# Patient Record
Sex: Female | Born: 1945 | Race: White | Hispanic: No | Marital: Married | Smoking: Never smoker
Health system: Southern US, Community
[De-identification: ages and names within clinical notes are randomized; demographics above are authoritative.]

## PROBLEM LIST (undated history)

## (undated) ENCOUNTER — Ambulatory Visit

## (undated) DIAGNOSIS — I10 Essential (primary) hypertension: Secondary | ICD-10-CM

## (undated) DIAGNOSIS — K5730 Diverticulosis of large intestine without perforation or abscess without bleeding: Secondary | ICD-10-CM

## (undated) DIAGNOSIS — Z87442 Personal history of urinary calculi: Secondary | ICD-10-CM

## (undated) DIAGNOSIS — G4730 Sleep apnea, unspecified: Secondary | ICD-10-CM

## (undated) DIAGNOSIS — Z8669 Personal history of other diseases of the nervous system and sense organs: Secondary | ICD-10-CM

## (undated) DIAGNOSIS — K5792 Diverticulitis of intestine, part unspecified, without perforation or abscess without bleeding: Secondary | ICD-10-CM

## (undated) DIAGNOSIS — M1990 Unspecified osteoarthritis, unspecified site: Secondary | ICD-10-CM

## (undated) HISTORY — PX: CATARACT EXTRACTION W/ INTRAOCULAR LENS  IMPLANT, BILATERAL: SHX1307

## (undated) HISTORY — PX: OTHER SURGICAL HISTORY: SHX169

## (undated) HISTORY — PX: BREAST CYST ASPIRATION: SHX578

## (undated) HISTORY — PX: REFRACTIVE SURGERY: SHX103

---

## 1997-08-21 ENCOUNTER — Ambulatory Visit (HOSPITAL_COMMUNITY): Admission: RE | Admit: 1997-08-21 | Discharge: 1997-08-21 | Payer: Self-pay | Admitting: Obstetrics & Gynecology

## 1998-07-25 ENCOUNTER — Ambulatory Visit: Admission: RE | Admit: 1998-07-25 | Discharge: 1998-07-25 | Payer: Self-pay | Admitting: *Deleted

## 1998-09-02 ENCOUNTER — Other Ambulatory Visit: Admission: RE | Admit: 1998-09-02 | Discharge: 1998-09-02 | Payer: Self-pay | Admitting: Obstetrics & Gynecology

## 2001-12-23 ENCOUNTER — Other Ambulatory Visit: Admission: RE | Admit: 2001-12-23 | Discharge: 2001-12-23 | Payer: Self-pay | Admitting: Obstetrics & Gynecology

## 2002-12-27 ENCOUNTER — Other Ambulatory Visit: Admission: RE | Admit: 2002-12-27 | Discharge: 2002-12-27 | Payer: Self-pay | Admitting: Obstetrics & Gynecology

## 2004-03-13 ENCOUNTER — Other Ambulatory Visit: Admission: RE | Admit: 2004-03-13 | Discharge: 2004-03-13 | Payer: Self-pay | Admitting: Obstetrics & Gynecology

## 2004-05-20 ENCOUNTER — Ambulatory Visit (HOSPITAL_COMMUNITY): Admission: RE | Admit: 2004-05-20 | Discharge: 2004-05-20 | Payer: Self-pay | Admitting: Gastroenterology

## 2004-08-05 ENCOUNTER — Ambulatory Visit (HOSPITAL_COMMUNITY): Admission: RE | Admit: 2004-08-05 | Discharge: 2004-08-05 | Payer: Self-pay | Admitting: Obstetrics & Gynecology

## 2004-08-05 ENCOUNTER — Encounter (INDEPENDENT_AMBULATORY_CARE_PROVIDER_SITE_OTHER): Payer: Self-pay | Admitting: Specialist

## 2005-03-17 ENCOUNTER — Other Ambulatory Visit: Admission: RE | Admit: 2005-03-17 | Discharge: 2005-03-17 | Payer: Self-pay | Admitting: Obstetrics & Gynecology

## 2006-10-12 ENCOUNTER — Encounter: Admission: RE | Admit: 2006-10-12 | Discharge: 2006-10-12 | Payer: Self-pay | Admitting: Obstetrics and Gynecology

## 2007-03-23 ENCOUNTER — Ambulatory Visit (HOSPITAL_COMMUNITY): Admission: RE | Admit: 2007-03-23 | Discharge: 2007-03-23 | Payer: Self-pay | Admitting: Obstetrics and Gynecology

## 2007-03-23 DIAGNOSIS — K5730 Diverticulosis of large intestine without perforation or abscess without bleeding: Secondary | ICD-10-CM

## 2007-03-23 DIAGNOSIS — K5792 Diverticulitis of intestine, part unspecified, without perforation or abscess without bleeding: Secondary | ICD-10-CM

## 2007-03-23 HISTORY — DX: Diverticulosis of large intestine without perforation or abscess without bleeding: K57.30

## 2007-03-23 HISTORY — DX: Diverticulitis of intestine, part unspecified, without perforation or abscess without bleeding: K57.92

## 2010-06-06 NOTE — Op Note (Signed)
Shelby Day, Shelby Day               ACCOUNT NO.:  [id number]   MEDICAL RECORD NO.:  [id number]          PATIENT TYPE:  AMB   LOCATION:  SDC                           FACILITY:  [facility]   PHYSICIAN:  Freddy Finner, M.D.   DATE OF BIRTH:  1945-08-27   DATE OF PROCEDURE:  08/05/2004  DATE OF DISCHARGE:                                 OPERATIVE REPORT   PREOPERATIVE DIAGNOSES:  1.  Postmenopausal bleeding.  2.  Intramural leiomyomata.  3.  Possible submucous myoma.   POSTOPERATIVE DIAGNOSES:  1.  Postmenopausal bleeding.  2.  Intramural leiomyomata.  3.  Possible submucous myoma.   OPERATION/PROCEDURE:  1.  Hysteroscopy.  2.  Dilatation and curettage.   ANESTHESIA:  Intravenous sedation.   ESTIMATED BLOOD LOSS:  Less than 10 mL.   SORBITOL DEFICIT:  100 mL.   INTRAOPERATIVE COMPLICATIONS:  None.   INDICATIONS:  The patient is a 65 year old with postmenopausal bleeding who  had a sonohystogram in the office showing intramural myomas and a suggestion  of a submucous myoma.  She is admitted now for hysteroscopy and dilatation  and curettage.   DESCRIPTION OF PROCEDURE:  She was admitted on the morning of the surgery.  She was brought to the operating room and placed on IV sedation, placed in  the dorsal lithotomy position using the Sussex stirrup system.  Betadine prep  was carried out in the usual fashion.  Bivalve speculum was placed in the  vagina.  The uterus had very low descensus and the procedure was compromised  by body habitus, specifically the patient's obesity.  An os finder was used  after grasping anterior cervical lip with a single-tooth tenaculum.  The  cervix and uterus later sounded to 10 cm.  The cervix was progressively  dilated to 25 with [facility] dilators.  Initial attempts to apply the [facility]  hysteroscope again was compromised by the angle of the cervix and the  obesity but by transferring the single-tooth tenaculum to the posterior  cervical lip, the procedure  was then essentially accomplished.  The  hysteroscope was introduced.  Inspection of the endometrial cavity revealed  what appeared to be thickening of the posterior myometrium above the level  of the internal cervical os posteriorly.  This was not felt to be  technically surgically addressed with the instrumentation at hand.  Gentle  thorough curettage was then carried out and exploration with Randall stone  forceps.  The tissue was submitted for histologic examination.  Repeat  inspection of the cavity was carried out and adequate sampling was felt to  be achieved using the curettage.  Photographs were made before and after  curettage and are retained in the office records.  The patient was then  taken to recovery room in good condition.   She will discharged in the immediate postoperative period for followup in  the office in approximately seven to 10 days.  She was given routine  outpatient surgical instructions.  She is to take her regular diet.       WRN/MEDQ  D:  08/05/2004  T:  08/06/2004  Job:  [id number]

## 2010-06-06 NOTE — Op Note (Signed)
Shelby Day, Shelby Day               ACCOUNT NO.:  [id number]   MEDICAL RECORD NO.:  [id number]          PATIENT TYPE:  AMB   LOCATION:  ENDO                         FACILITY:  [facility]   PHYSICIAN:  John C. Madilyn Fireman, M.D.    DATE OF BIRTH:  [birth year]   DATE OF PROCEDURE:  05/20/2004  DATE OF DISCHARGE:                                 OPERATIVE REPORT   PROCEDURE:  Colonoscopy.   ENDOSCOPIST:  Everardo All. Madilyn Fireman, M.D.   INDICATIONS FOR PROCEDURE:  Average risk colon cancer screening.   PROCEDURE:  The patient was placed in the left lateral decubitus position  and placed on the pulse monitor with continuous low-flow oxygen delivered by  nasal cannula. She was sedated with 100 mcg of intravenous fentanyl and 10  mg of intravenous Versed. The Olympus video colonoscope was inserted into  the rectum and advanced to the cecum, confirmed by transillumination of  McBurney's point and visualization of the ileocecal valve and appendiceal  orifice. Prep was excellent. The cecum, ascending, transverse, descending,  sigmoid, and rectum all appeared normal with no masses, polyps, diverticula,  or other mucosal abnormalities. The scope was then withdrawn and the patient  returned to the recovery room in stable condition. She tolerated the  procedure well and there were no immediate complications.   IMPRESSION:  Normal colonoscopy.   PLAN:  Next colonoscopy within 10 years and consider flexible sigmoidoscopy  in 5 years.      JCH/MEDQ  D:  05/20/2004  T:  05/20/2004  Job:  [id number]   cc:   Freddy Finner, M.D.  Fax: [phone]

## 2010-10-13 LAB — CREATININE, SERUM
Creatinine, Ser: 0.66
GFR calc non Af Amer: >60

## 2012-06-07 ENCOUNTER — Other Ambulatory Visit: Payer: Self-pay | Admitting: Obstetrics and Gynecology

## 2012-06-07 DIAGNOSIS — R928 Other abnormal and inconclusive findings on diagnostic imaging of breast: Secondary | ICD-10-CM

## 2012-06-17 ENCOUNTER — Ambulatory Visit
Admission: RE | Admit: 2012-06-17 | Discharge: 2012-06-17 | Disposition: A | Discharge status: Home / Self Care | Payer: Medicare Other | Source: Ambulatory Visit | Attending: Obstetrics and Gynecology | Admitting: Obstetrics and Gynecology

## 2012-06-17 DIAGNOSIS — R928 Other abnormal and inconclusive findings on diagnostic imaging of breast: Secondary | ICD-10-CM

## 2012-11-16 ENCOUNTER — Other Ambulatory Visit: Payer: Self-pay | Admitting: Obstetrics and Gynecology

## 2012-11-16 DIAGNOSIS — N6489 Other specified disorders of breast: Secondary | ICD-10-CM

## 2012-12-20 ENCOUNTER — Ambulatory Visit
Admission: RE | Admit: 2012-12-20 | Discharge: 2012-12-20 | Disposition: A | Discharge status: Home / Self Care | Payer: Medicare Other | Source: Ambulatory Visit | Attending: Obstetrics and Gynecology | Admitting: Obstetrics and Gynecology

## 2012-12-20 DIAGNOSIS — N6489 Other specified disorders of breast: Secondary | ICD-10-CM

## 2014-09-10 ENCOUNTER — Other Ambulatory Visit: Payer: Self-pay | Admitting: Obstetrics and Gynecology

## 2014-09-11 LAB — CYTOLOGY - PAP

## 2016-03-17 ENCOUNTER — Other Ambulatory Visit: Payer: Self-pay | Admitting: Obstetrics and Gynecology

## 2016-03-17 DIAGNOSIS — Z1231 Encounter for screening mammogram for malignant neoplasm of breast: Secondary | ICD-10-CM

## 2016-03-17 DIAGNOSIS — Z803 Family history of malignant neoplasm of breast: Secondary | ICD-10-CM

## 2016-04-02 ENCOUNTER — Encounter: Payer: Self-pay | Admitting: Radiology

## 2016-04-02 ENCOUNTER — Ambulatory Visit
Admission: RE | Admit: 2016-04-02 | Discharge: 2016-04-02 | Disposition: A | Discharge status: Home / Self Care | Payer: Medicare Other | Source: Ambulatory Visit | Attending: Obstetrics and Gynecology | Admitting: Obstetrics and Gynecology

## 2016-04-02 DIAGNOSIS — Z803 Family history of malignant neoplasm of breast: Secondary | ICD-10-CM

## 2016-04-02 DIAGNOSIS — Z1231 Encounter for screening mammogram for malignant neoplasm of breast: Secondary | ICD-10-CM

## 2016-05-18 ENCOUNTER — Ambulatory Visit (INDEPENDENT_AMBULATORY_CARE_PROVIDER_SITE_OTHER): Payer: Medicare Other | Admitting: Orthopaedic Surgery

## 2016-05-18 ENCOUNTER — Ambulatory Visit (INDEPENDENT_AMBULATORY_CARE_PROVIDER_SITE_OTHER): Payer: Medicare Other

## 2016-05-18 ENCOUNTER — Encounter (INDEPENDENT_AMBULATORY_CARE_PROVIDER_SITE_OTHER): Payer: Self-pay

## 2016-05-18 DIAGNOSIS — M1612 Unilateral primary osteoarthritis, left hip: Secondary | ICD-10-CM | POA: Insufficient documentation

## 2016-05-18 DIAGNOSIS — M25552 Pain in left hip: Secondary | ICD-10-CM | POA: Diagnosis not present

## 2016-05-18 NOTE — Progress Notes (Signed)
Office Visit Note   Patient: Shelby Day           Date of Birth: 1945-01-25           MRN: [id number] Visit Date: 05/18/2016              Requested by: Melrose Nakayama, MD [place], [place] [address] PCP: Gerrit Heck, MD   Assessment & Plan: Visit Diagnoses:  1. Pain in left hip   2. Unilateral primary osteoarthritis, left hip     Plan: We had quite a long and thorough discussion in the time spent in clinic was almost 50 minutes. She has severe arthritis of her left hip and is detrimentally affected her activities daily living, her quality of life, and her mobility. I am recommending hip replacement surgery. I do feel that this could improve her quality of life as well as improve her mobility and decrease her pain. We had a discussion of what the surgery involves. We will over x-rays as well as the risk and benefits of surgery and had a thorough discussion about her intraoperative and postoperative course. All questions were encouraged and answered. She would like to have this surgery done sometime over the next month or 2. I gave her our surgery scheduler's card and she will look at potential dates and talked her further about this. I'm happy for her to give [place] a call at anytime to discuss further as well.  Follow-Up Instructions: Return for 2 weeks post-op.   Orders:  Orders Placed This Encounter  Procedures  . XR HIP UNILAT W OR W/O PELVIS 2-3 VIEWS LEFT   No orders of the defined types were placed in this encounter.     Procedures: No procedures performed   Clinical Data: No additional findings.   Subjective: No chief complaint on file. The patient comes in with chief complaint of left hip pain is been worsening over a year now. There is been no known injury. She says her sleep is decreased now secondary to pain. She's been on naproxen twice a day and that does help at times. He is working on activity modification as well as weight loss. She's  had a steroid injection her hip back in October by another physician in town. She said that helped a little bit but it didn't last long. She has pain with prolonged sitting when she gets up walking. She has difficulty getting out of a car as well as out of bed. She's work on activity modification and weight loss. She is gotten to where her pain is 10 out of 10. It's detrimentally affected her activity is daily living, her quality of life, and her mobility. She did see another orthopedic surgeon in town who recommended seeing me for direct anterior hip surgery as well as seeing her did him my experience with performing surgery morbidly obese population. Her husband is with her today. She is very pleasant individual to talk with an discuss issues with.  HPI  Review of Systems She denies any headache, chest pain, short of breath, fever, chills, nausea, vomiting.  Objective: Vital Signs: There were no vitals taken for this visit.  Physical Exam She is alert and oriented 3 and in no acute distress. She does walk with a slight limp. Ortho Exam Examination of her right nonpainful hip shows a normal hip exam. Examination of her left hip shows severe pain with internal/external rotation and a slight deficit and internal or external rotation. She has  slight knee pain on the left side as well but her radiates down from the hip. She has no pain of the trochanteric area and no back pain. Her leg lengths are near equal. She is neurovascularly intact. Specialty Comments:  No specialty comments available.  Imaging: Xr Hip Unilat W Or W/o Pelvis 2-3 Views Left  Result Date: 05/18/2016 An AP pelvis and a lateral of her left hip show severe arthritic changes of left hip comparing left and right hips. There is significant loss of the hip joint space. There sclerotic changes and cystic changes. 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    PMFS History: Patient Active Problem List    Diagnosis Date Noted  . Unilateral primary osteoarthritis, left hip 05/18/2016   No past medical history on file.  Family History  Problem Relation Age of Onset  . Breast cancer Sister     No past surgical history on file. Social History   Occupational History  . Not on file.   Social History Main Topics  . Smoking status: Not on file  . Smokeless tobacco: Not on file  . Alcohol use Not on file  . Drug use: Unknown  . Sexual activity: Not on file

## 2017-02-26 ENCOUNTER — Other Ambulatory Visit: Payer: Self-pay | Admitting: Obstetrics and Gynecology

## 2017-02-26 ENCOUNTER — Other Ambulatory Visit: Payer: Self-pay | Admitting: Family Medicine

## 2017-02-26 DIAGNOSIS — Z1231 Encounter for screening mammogram for malignant neoplasm of breast: Secondary | ICD-10-CM

## 2017-04-05 ENCOUNTER — Ambulatory Visit
Admission: RE | Admit: 2017-04-05 | Discharge: 2017-04-05 | Disposition: A | Discharge status: Home / Self Care | Payer: Medicare Other | Source: Ambulatory Visit | Attending: Family Medicine | Admitting: Family Medicine

## 2017-04-05 DIAGNOSIS — Z1231 Encounter for screening mammogram for malignant neoplasm of breast: Secondary | ICD-10-CM

## 2017-04-28 ENCOUNTER — Ambulatory Visit (INDEPENDENT_AMBULATORY_CARE_PROVIDER_SITE_OTHER): Payer: Medicare Other | Admitting: Orthopaedic Surgery

## 2017-04-28 ENCOUNTER — Ambulatory Visit (INDEPENDENT_AMBULATORY_CARE_PROVIDER_SITE_OTHER): Payer: Medicare Other

## 2017-04-28 ENCOUNTER — Encounter (INDEPENDENT_AMBULATORY_CARE_PROVIDER_SITE_OTHER): Payer: Self-pay | Admitting: Orthopaedic Surgery

## 2017-04-28 DIAGNOSIS — M1612 Unilateral primary osteoarthritis, left hip: Secondary | ICD-10-CM | POA: Diagnosis not present

## 2017-04-28 DIAGNOSIS — M25552 Pain in left hip: Secondary | ICD-10-CM | POA: Diagnosis not present

## 2017-04-28 NOTE — Progress Notes (Signed)
The patient is well-known to [place].  She has severe debilitating arthritis of her left hip is well documented.  We saw her for this last year and had a long thorough discussion about hip replacement surgery.  She is 72 years old and weighs about 270 pounds.  She says at this point her pain is daily.  She is walking with a significant limp and guarding that left hip.  She says her body is telling her that it is time.  Her pain can be 10 out of 10.  It is detrimentally affected her activities of daily living, her mobility, and her quality of life.  At this point she does wish to proceed with a total hip arthroplasty and that is actually scheduled already for June of this year.  She is not a smoker and not a diabetic.  She has no other significant active medical problems.  He does take a baby aspirin daily.  He takes naproxen for inflammation and pain.  On examination she has significant limitations of range of motion of her left hip with pain on range of motion as well.  She is having some right hip pain and right knee pain and some of this is the story.  I can move her right hip much easier.  X-rays of her left hip confirm severe end-stage arthritis of left hip with loss of joint space and sclerotic changes as well as para-articular osteophytes.  This point we gave her another handout on hip replacement surgery.  I talked in detail about what her intraoperative and postoperative course were involved.  I counseled about weight loss.  I do feel comfortable proceeding with the surgery based on the soft tissue plane and laying in a spine position.  We talked about all the risk and benefits involved with surgery.  All questions concerns were answered and addressed.  Her husband was with her as well and we went over everything we could.  We would then see her back in 2 weeks postoperative after surgery.

## 2017-06-25 ENCOUNTER — Telehealth (INDEPENDENT_AMBULATORY_CARE_PROVIDER_SITE_OTHER): Payer: Self-pay | Admitting: Orthopaedic Surgery

## 2017-06-25 NOTE — Telephone Encounter (Signed)
Patient called wanting to know what will she need to do prior to surgery. Patient want to know will she be called for a  pre-op  appointment and had questions about any payments due prior to surgery. Patient said she just don't know how to prepare for surgery. The number to contact patient is [phone]

## 2017-06-29 ENCOUNTER — Other Ambulatory Visit (INDEPENDENT_AMBULATORY_CARE_PROVIDER_SITE_OTHER): Payer: Self-pay | Admitting: Physician Assistant

## 2017-06-30 NOTE — Telephone Encounter (Signed)
I called patient and discussed.

## 2017-07-01 ENCOUNTER — Other Ambulatory Visit (INDEPENDENT_AMBULATORY_CARE_PROVIDER_SITE_OTHER): Payer: Self-pay

## 2017-07-02 ENCOUNTER — Encounter (HOSPITAL_COMMUNITY): Payer: Self-pay

## 2017-07-02 NOTE — Patient Instructions (Addendum)
Your procedure is scheduled on: Friday, July 09, 2017   Surgery Time:  12Noon-1:30PM   Report to [facility] Main  Entrance    Report to admitting at 9:30 AM   Call this number if you have problems the morning of surgery [phone]   Do not eat food  Or drink liquids after midnight      Contacts, dentures or bridgework may not be worn into surgery.   Leave suitcase in the car. After surgery it may be brought to your room.   Special Instructions: Bring a copy of your healthcare power of attorney and living will documents         the day of surgery if you haven't scanned them in before.              Please read over the following fact sheets you were given:  [facility] - Preparing for Surgery Before surgery, you can play an important role.  Because skin is not sterile, your skin needs to be as free of germs as possible.  You can reduce the number of germs on your skin by washing with CHG (chlorahexidine gluconate) soap before surgery.  CHG is an antiseptic cleaner which kills germs and bonds with the skin to continue killing germs even after washing. Please DO NOT use if you have an allergy to CHG or antibacterial soaps.  If your skin becomes reddened/irritated stop using the CHG and inform your nurse when you arrive at Short Stay. Do not shave (including legs and underarms) for at least 48 hours prior to the first CHG shower.  You may shave your face/neck.  Please follow these instructions carefully:  1.  Shower with CHG Soap the night before surgery and the  morning of surgery.  2.  If you choose to wash your hair, wash your hair first as usual with your normal  shampoo.  3.  After you shampoo, rinse your hair and body thoroughly to remove the shampoo.                             4.  Use CHG as you would any other liquid soap.  You can apply chg directly to the skin and wash.  Gently with a scrungie or clean washcloth.  5.  Apply the CHG Soap to your body ONLY FROM  THE NECK DOWN.   Do   not use on face/ open                           Wound or open sores. Avoid contact with eyes, ears mouth and   genitals (private parts).                       Wash face,  Genitals (private parts) with your normal soap.             6.  Wash thoroughly, paying special attention to the area where your    surgery  will be performed.  7.  Thoroughly rinse your body with warm water from the neck down.  8.  DO NOT shower/wash with your normal soap after using and rinsing off the CHG Soap.                9.  Pat yourself dry with a clean towel.  10.  Wear clean pajamas.            11.  Place clean sheets on your bed the night of your first shower and do not  sleep with pets. Day of Surgery : Do not apply any lotions/deodorants the morning of surgery.  Please wear clean clothes to the hospital/surgery center.  FAILURE TO FOLLOW THESE INSTRUCTIONS MAY RESULT IN THE CANCELLATION OF YOUR SURGERY  PATIENT SIGNATURE_________________________________  NURSE SIGNATURE__________________________________  ________________________________________________________________________   Shelby Day  An incentive spirometer is a tool that can help keep your lungs clear and active. This tool measures how well you are filling your lungs with each breath. Taking long deep breaths may help reverse or decrease the chance of developing breathing (pulmonary) problems (especially infection) following:  A long period of time when you are unable to move or be active. BEFORE THE PROCEDURE   If the spirometer includes an indicator to show your best effort, your nurse or respiratory therapist will set it to a desired goal.  If possible, sit up straight or lean slightly forward. Try not to slouch.  Hold the incentive spirometer in an upright position. INSTRUCTIONS FOR USE  1. Sit on the edge of your bed if possible, or sit up as far as you can in bed or on a chair. 2. Hold the  incentive spirometer in an upright position. 3. Breathe out normally. 4. Place the mouthpiece in your mouth and seal your lips tightly around it. 5. Breathe in slowly and as deeply as possible, raising the piston or the ball toward the top of the column. 6. Hold your breath for 3-5 seconds or for as long as possible. Allow the piston or ball to fall to the bottom of the column. 7. Remove the mouthpiece from your mouth and breathe out normally. 8. Rest for a few seconds and repeat Steps 1 through 7 at least 10 times every 1-2 hours when you are awake. Take your time and take a few normal breaths between deep breaths. 9. The spirometer may include an indicator to show your best effort. Use the indicator as a goal to work toward during each repetition. 10. After each set of 10 deep breaths, practice coughing to be sure your lungs are clear. If you have an incision (the cut made at the time of surgery), support your incision when coughing by placing a pillow or rolled up towels firmly against it. Once you are able to get out of bed, walk around indoors and cough well. You may stop using the incentive spirometer when instructed by your caregiver.  RISKS AND COMPLICATIONS  Take your time so you do not get dizzy or light-headed.  If you are in pain, you may need to take or ask for pain medication before doing incentive spirometry. It is harder to take a deep breath if you are having pain. AFTER USE  Rest and breathe slowly and easily.  It can be helpful to keep track of a log of your progress. Your caregiver can provide you with a simple table to help with this. If you are using the spirometer at home, follow these instructions: [place] IF:   You are having difficultly using the spirometer.  You have trouble using the spirometer as often as instructed.  Your pain medication is not giving enough relief while using the spirometer.  You develop fever of 100.5 F (38.1 C) or  higher. SEEK IMMEDIATE MEDICAL CARE IF:   You cough up  bloody sputum that had not been present before.  You develop fever of 102 F (38.9 C) or greater.  You develop worsening pain at or near the incision site. MAKE SURE YOU:   Understand these instructions.  Will watch your condition.  Will get help right away if you are not doing well or get worse. Document Released: 05/18/2006 Document Revised: 03/30/2011 Document Reviewed: 07/19/2006 Exit[employer] Patient Information 2014 Exit[employer], [place].   ________________________________________________________________________  WHAT IS A BLOOD TRANSFUSION? Blood Transfusion Information  A transfusion is the replacement of blood or some of its parts. Blood is made up of multiple cells which provide different functions.  Red blood cells carry oxygen and are used for blood loss replacement.  White blood cells fight against infection.  Platelets control bleeding.  Plasma helps clot blood.  Other blood products are available for specialized needs, such as hemophilia or other clotting disorders. BEFORE THE TRANSFUSION  Who gives blood for transfusions?   Healthy volunteers who are fully evaluated to make sure their blood is safe. This is blood bank blood. Transfusion therapy is the safest it has ever been in the practice of medicine. Before blood is taken from a donor, a complete history is taken to make sure that person has no history of diseases nor engages in risky social behavior (examples are intravenous drug use or sexual activity with multiple partners). The donor's travel history is screened to minimize risk of transmitting infections, such as malaria. The donated blood is tested for signs of infectious diseases, such as HIV and hepatitis. The blood is then tested to be sure it is compatible with you in order to minimize the chance of a transfusion reaction. If you or a relative donates blood, this is often done in anticipation of surgery  and is not appropriate for emergency situations. It takes many days to process the donated blood. RISKS AND COMPLICATIONS Although transfusion therapy is very safe and saves many lives, the main dangers of transfusion include:   Getting an infectious disease.  Developing a transfusion reaction. This is an allergic reaction to something in the blood you were given. Every precaution is taken to prevent this. The decision to have a blood transfusion has been considered carefully by your caregiver before blood is given. Blood is not given unless the benefits outweigh the risks. AFTER THE TRANSFUSION  Right after receiving a blood transfusion, you will usually feel much better and more energetic. This is especially true if your red blood cells have gotten low (anemic). The transfusion raises the level of the red blood cells which carry oxygen, and this usually causes an energy increase.  The nurse administering the transfusion will monitor you carefully for complications. HOME CARE INSTRUCTIONS  No special instructions are needed after a transfusion. You may find your energy is better. Speak with your caregiver about any limitations on activity for underlying diseases you may have. SEEK MEDICAL CARE IF:   Your condition is not improving after your transfusion.  You develop redness or irritation at the intravenous (IV) site. SEEK IMMEDIATE MEDICAL CARE IF:  Any of the following symptoms occur over the next 12 hours:  Shaking chills.  You have a temperature by mouth above 102 F (38.9 C), not controlled by medicine.  Chest, back, or muscle pain.  People around you feel you are not acting correctly or are confused.  Shortness of breath or difficulty breathing.  Dizziness and fainting.  You get a rash or develop hives.  You have a  decrease in urine output.  Your urine turns a dark color or changes to pink, red, or brown. Any of the following symptoms occur over the next 10  days:  You have a temperature by mouth above 102 F (38.9 C), not controlled by medicine.  Shortness of breath.  Weakness after normal activity.  The white part of the eye turns yellow (jaundice).  You have a decrease in the amount of urine or are urinating less often.  Your urine turns a dark color or changes to pink, red, or brown. Document Released: 01/03/2000 Document Revised: 03/30/2011 Document Reviewed: 08/22/2007 [facility] Patient Information 2014 [place], [place].  _______________________________________________________________________

## 2017-07-05 ENCOUNTER — Telehealth (INDEPENDENT_AMBULATORY_CARE_PROVIDER_SITE_OTHER): Payer: Self-pay | Admitting: Orthopaedic Surgery

## 2017-07-05 ENCOUNTER — Encounter (HOSPITAL_COMMUNITY)
Admission: RE | Admit: 2017-07-05 | Discharge: 2017-07-05 | Disposition: A | Discharge status: Home / Self Care | Payer: Medicare Other | Source: Ambulatory Visit | Attending: Orthopaedic Surgery | Admitting: Orthopaedic Surgery

## 2017-07-05 ENCOUNTER — Other Ambulatory Visit: Payer: Self-pay

## 2017-07-05 ENCOUNTER — Encounter (HOSPITAL_COMMUNITY): Payer: Self-pay

## 2017-07-05 DIAGNOSIS — Z01810 Encounter for preprocedural cardiovascular examination: Secondary | ICD-10-CM | POA: Diagnosis present

## 2017-07-05 DIAGNOSIS — Z01812 Encounter for preprocedural laboratory examination: Secondary | ICD-10-CM | POA: Diagnosis present

## 2017-07-05 HISTORY — DX: Personal history of other diseases of the nervous system and sense organs: Z86.69

## 2017-07-05 HISTORY — DX: Diverticulitis of intestine, part unspecified, without perforation or abscess without bleeding: K57.92

## 2017-07-05 HISTORY — DX: Diverticulosis of large intestine without perforation or abscess without bleeding: K57.30

## 2017-07-05 HISTORY — DX: Personal history of urinary calculi: Z87.442

## 2017-07-05 HISTORY — DX: Essential (primary) hypertension: I10

## 2017-07-05 HISTORY — DX: Unspecified osteoarthritis, unspecified site: M19.90

## 2017-07-05 HISTORY — DX: Sleep apnea, unspecified: G47.30

## 2017-07-05 LAB — CBC
HEMATOCRIT: 44.5 % (ref 36.0–46.0)
Hemoglobin: 15.3 g/dL — ABNORMAL HIGH (ref 12.0–15.0)
MCH: 33.8 pg (ref 26.0–34.0)
MCHC: 34.4 g/dL (ref 30.0–36.0)
MCV: 98.5 fL (ref 78.0–100.0)
Platelets: 236 10*3/uL (ref 150–400)
RBC: 4.52 MIL/uL (ref 3.87–5.11)
RDW: 13.3 % (ref 11.5–15.5)
WBC: 6.4 10*3/uL (ref 4.0–10.5)

## 2017-07-05 LAB — BASIC METABOLIC PANEL
Anion gap: 7 (ref 5–15)
BUN: 15 mg/dL (ref 6–20)
CO2: 30 mmol/L (ref 22–32)
Calcium: 9.6 mg/dL (ref 8.9–10.3)
Chloride: 109 mmol/L (ref 101–111)
Creatinine, Ser: 0.82 mg/dL (ref 0.44–1.00)
GFR calc non Af Amer: >60 mL/min (ref >60)
Glucose, Bld: 103 mg/dL — ABNORMAL HIGH (ref 65–99)
POTASSIUM: 4.2 mmol/L (ref 3.5–5.1)
SODIUM: 146 mmol/L — AB (ref 135–145)

## 2017-07-05 LAB — SURGICAL PCR SCREEN
MRSA, PCR: NEGATIVE
STAPHYLOCOCCUS AUREUS: POSITIVE — AB

## 2017-07-05 LAB — ABO/RH: ABO/RH(D): B POS

## 2017-07-05 NOTE — Telephone Encounter (Signed)
Gracee with [employer] called needing a PA called into  [facility] for [place].    The phone# is [phone]

## 2017-07-07 NOTE — Telephone Encounter (Signed)
Another one, I don't know what to do?

## 2017-07-07 NOTE — Telephone Encounter (Signed)
Luther Hearing from [place] an email to check on this

## 2017-07-07 NOTE — Telephone Encounter (Signed)
I assume either the social workers at the hospital or [facility] agency does this.  Check with [place].

## 2017-07-08 MED ORDER — TRANEXAMIC ACID 1000 MG/10ML IV SOLN
1000.0000 mg | INTRAVENOUS | Status: AC
  Administered 2017-07-09: 1000 mg via INTRAVENOUS
  Filled 2017-07-08: qty 1100

## 2017-07-08 MED ORDER — DEXTROSE 5 % IV SOLN
3.0000 g | INTRAVENOUS | Status: AC
  Administered 2017-07-09: 3 g via INTRAVENOUS
  Filled 2017-07-08: qty 3

## 2017-07-09 ENCOUNTER — Inpatient Hospital Stay (HOSPITAL_COMMUNITY): Payer: Medicare Other

## 2017-07-09 ENCOUNTER — Inpatient Hospital Stay (HOSPITAL_COMMUNITY): Payer: Medicare Other | Admitting: Anesthesiology

## 2017-07-09 ENCOUNTER — Other Ambulatory Visit: Payer: Self-pay

## 2017-07-09 ENCOUNTER — Encounter (HOSPITAL_COMMUNITY): Payer: Self-pay | Admitting: *Deleted

## 2017-07-09 ENCOUNTER — Inpatient Hospital Stay (HOSPITAL_COMMUNITY)
Admission: RE | Admit: 2017-07-09 | Discharge: 2017-07-11 | DRG: 470 | Disposition: A | Discharge status: Home Health | Payer: Medicare Other | Source: Ambulatory Visit | Attending: Orthopaedic Surgery | Admitting: Orthopaedic Surgery

## 2017-07-09 ENCOUNTER — Encounter (HOSPITAL_COMMUNITY)
Admission: RE | Disposition: A | Discharge status: Home Health | Payer: Self-pay | Source: Ambulatory Visit | Attending: Orthopaedic Surgery

## 2017-07-09 DIAGNOSIS — Z9181 History of falling: Secondary | ICD-10-CM

## 2017-07-09 DIAGNOSIS — F329 Major depressive disorder, single episode, unspecified: Secondary | ICD-10-CM | POA: Diagnosis present

## 2017-07-09 DIAGNOSIS — Z79899 Other long term (current) drug therapy: Secondary | ICD-10-CM | POA: Diagnosis not present

## 2017-07-09 DIAGNOSIS — Z6841 Body Mass Index (BMI) 40.0 and over, adult: Secondary | ICD-10-CM

## 2017-07-09 DIAGNOSIS — M1612 Unilateral primary osteoarthritis, left hip: Secondary | ICD-10-CM | POA: Diagnosis present

## 2017-07-09 DIAGNOSIS — M25551 Pain in right hip: Secondary | ICD-10-CM

## 2017-07-09 DIAGNOSIS — Z885 Allergy status to narcotic agent status: Secondary | ICD-10-CM | POA: Diagnosis not present

## 2017-07-09 DIAGNOSIS — I10 Essential (primary) hypertension: Secondary | ICD-10-CM | POA: Diagnosis present

## 2017-07-09 DIAGNOSIS — G4730 Sleep apnea, unspecified: Secondary | ICD-10-CM | POA: Diagnosis present

## 2017-07-09 DIAGNOSIS — Z9989 Dependence on other enabling machines and devices: Secondary | ICD-10-CM | POA: Diagnosis not present

## 2017-07-09 DIAGNOSIS — Z96642 Presence of left artificial hip joint: Secondary | ICD-10-CM

## 2017-07-09 HISTORY — PX: TOTAL HIP ARTHROPLASTY: SHX124

## 2017-07-09 LAB — TYPE AND SCREEN
ABO/RH(D): B POS
ANTIBODY SCREEN: NEGATIVE

## 2017-07-09 SURGERY — ARTHROPLASTY, HIP, TOTAL, ANTERIOR APPROACH
Anesthesia: Spinal | Site: Hip | Laterality: Left

## 2017-07-09 MED ORDER — DEXAMETHASONE SODIUM PHOSPHATE 10 MG/ML IJ SOLN
INTRAMUSCULAR | Status: DC | PRN
  Administered 2017-07-09: 10 mg via INTRAVENOUS

## 2017-07-09 MED ORDER — CALCIUM CARBONATE 1250 (500 CA) MG PO TABS
1.0000 | ORAL_TABLET | Freq: Every day | ORAL | Status: DC
  Administered 2017-07-10 – 2017-07-11 (×2): 500 mg via ORAL
  Filled 2017-07-09 (×2): qty 1

## 2017-07-09 MED ORDER — HYDROCODONE-ACETAMINOPHEN 5-325 MG PO TABS
1.0000 | ORAL_TABLET | ORAL | Status: DC | PRN
  Administered 2017-07-09 (×2): 2 via ORAL
  Administered 2017-07-10: 1 via ORAL
  Filled 2017-07-09: qty 1
  Filled 2017-07-09 (×2): qty 2

## 2017-07-09 MED ORDER — ASPIRIN 81 MG PO CHEW
81.0000 mg | CHEWABLE_TABLET | Freq: Two times a day (BID) | ORAL | Status: DC
  Administered 2017-07-09 – 2017-07-11 (×4): 81 mg via ORAL
  Filled 2017-07-09 (×4): qty 1

## 2017-07-09 MED ORDER — MIDAZOLAM HCL 2 MG/2ML IJ SOLN
INTRAMUSCULAR | Status: DC | PRN
  Administered 2017-07-09 (×2): 1 mg via INTRAVENOUS

## 2017-07-09 MED ORDER — GABAPENTIN 100 MG PO CAPS
100.0000 mg | ORAL_CAPSULE | Freq: Three times a day (TID) | ORAL | Status: DC
  Administered 2017-07-09 – 2017-07-11 (×6): 100 mg via ORAL
  Filled 2017-07-09 (×6): qty 1

## 2017-07-09 MED ORDER — TRAMADOL HCL 50 MG PO TABS
50.0000 mg | ORAL_TABLET | Freq: Four times a day (QID) | ORAL | Status: DC | PRN
Start: 2017-07-09 — End: 2017-07-11

## 2017-07-09 MED ORDER — 0.9 % SODIUM CHLORIDE (POUR BTL) OPTIME
TOPICAL | Status: DC | PRN
  Administered 2017-07-09: 1000 mL

## 2017-07-09 MED ORDER — ONDANSETRON HCL 4 MG/2ML IJ SOLN
INTRAMUSCULAR | Status: AC
  Filled 2017-07-09: qty 2

## 2017-07-09 MED ORDER — PANTOPRAZOLE SODIUM 40 MG PO TBEC
40.0000 mg | DELAYED_RELEASE_TABLET | Freq: Every day | ORAL | Status: DC
  Administered 2017-07-09 – 2017-07-11 (×3): 40 mg via ORAL
  Filled 2017-07-09 (×3): qty 1

## 2017-07-09 MED ORDER — LACTATED RINGERS IV SOLN
INTRAVENOUS | Status: DC
  Administered 2017-07-09 (×2): via INTRAVENOUS

## 2017-07-09 MED ORDER — CHLORHEXIDINE GLUCONATE 4 % EX LIQD: 60.0000 mL | Freq: Once | CUTANEOUS | Status: DC

## 2017-07-09 MED ORDER — ACETAMINOPHEN 500 MG PO TABS
1000.0000 mg | ORAL_TABLET | Freq: Once | ORAL | Status: AC
  Administered 2017-07-09: 1000 mg via ORAL
  Filled 2017-07-09: qty 2

## 2017-07-09 MED ORDER — FENTANYL CITRATE (PF) 100 MCG/2ML IJ SOLN
INTRAMUSCULAR | Status: DC | PRN
  Administered 2017-07-09 (×2): 50 ug via INTRAVENOUS

## 2017-07-09 MED ORDER — FENTANYL CITRATE (PF) 100 MCG/2ML IJ SOLN: 25.0000 ug | INTRAMUSCULAR | Status: DC | PRN

## 2017-07-09 MED ORDER — DEXAMETHASONE SODIUM PHOSPHATE 10 MG/ML IJ SOLN
INTRAMUSCULAR | Status: AC
  Filled 2017-07-09: qty 1

## 2017-07-09 MED ORDER — ONDANSETRON HCL 4 MG PO TABS: 4.0000 mg | ORAL_TABLET | Freq: Four times a day (QID) | ORAL | Status: DC | PRN

## 2017-07-09 MED ORDER — FENTANYL CITRATE (PF) 100 MCG/2ML IJ SOLN
INTRAMUSCULAR | Status: AC
  Filled 2017-07-09: qty 2

## 2017-07-09 MED ORDER — SODIUM CHLORIDE 0.9 % IR SOLN
Status: DC | PRN
  Administered 2017-07-09: 1000 mL

## 2017-07-09 MED ORDER — PROPOFOL 10 MG/ML IV BOLUS
INTRAVENOUS | Status: AC
  Filled 2017-07-09: qty 40

## 2017-07-09 MED ORDER — ONDANSETRON HCL 4 MG/2ML IJ SOLN: 4.0000 mg | Freq: Four times a day (QID) | INTRAMUSCULAR | Status: DC | PRN

## 2017-07-09 MED ORDER — SODIUM CHLORIDE 0.9 % IV SOLN
INTRAVENOUS | Status: DC
  Administered 2017-07-09: 16:00:00 via INTRAVENOUS

## 2017-07-09 MED ORDER — DIPHENHYDRAMINE HCL 12.5 MG/5ML PO ELIX: 12.5000 mg | ORAL_SOLUTION | ORAL | Status: DC | PRN

## 2017-07-09 MED ORDER — MORPHINE SULFATE (PF) 2 MG/ML IV SOLN: 0.5000 mg | INTRAVENOUS | Status: DC | PRN

## 2017-07-09 MED ORDER — PROPOFOL 500 MG/50ML IV EMUL
INTRAVENOUS | Status: DC | PRN
  Administered 2017-07-09: 50 ug/kg/min via INTRAVENOUS

## 2017-07-09 MED ORDER — ACETAMINOPHEN 325 MG PO TABS: 325.0000 mg | ORAL_TABLET | Freq: Four times a day (QID) | ORAL | Status: DC | PRN

## 2017-07-09 MED ORDER — DOCUSATE SODIUM 100 MG PO CAPS
100.0000 mg | ORAL_CAPSULE | Freq: Two times a day (BID) | ORAL | Status: DC
  Administered 2017-07-09 – 2017-07-11 (×4): 100 mg via ORAL
  Filled 2017-07-09 (×4): qty 1

## 2017-07-09 MED ORDER — VITAMIN C 500 MG PO TABS
1000.0000 mg | ORAL_TABLET | Freq: Every morning | ORAL | Status: DC
  Administered 2017-07-10 – 2017-07-11 (×2): 1000 mg via ORAL
  Filled 2017-07-09 (×2): qty 2

## 2017-07-09 MED ORDER — POLYETHYLENE GLYCOL 3350 17 G PO PACK: 17.0000 g | PACK | Freq: Every day | ORAL | Status: DC | PRN

## 2017-07-09 MED ORDER — ALUM & MAG HYDROXIDE-SIMETH 200-200-20 MG/5ML PO SUSP: 30.0000 mL | ORAL | Status: DC | PRN

## 2017-07-09 MED ORDER — METHOCARBAMOL 500 MG PO TABS
500.0000 mg | ORAL_TABLET | Freq: Four times a day (QID) | ORAL | Status: DC | PRN
  Administered 2017-07-09 – 2017-07-11 (×5): 500 mg via ORAL
  Filled 2017-07-09 (×5): qty 1

## 2017-07-09 MED ORDER — STERILE WATER FOR IRRIGATION IR SOLN
Status: DC | PRN
  Administered 2017-07-09: 2000 mL

## 2017-07-09 MED ORDER — ONDANSETRON HCL 4 MG/2ML IJ SOLN
INTRAMUSCULAR | Status: DC | PRN
  Administered 2017-07-09: 4 mg via INTRAVENOUS

## 2017-07-09 MED ORDER — ONDANSETRON HCL 4 MG/2ML IJ SOLN: 4.0000 mg | Freq: Once | INTRAMUSCULAR | Status: DC | PRN

## 2017-07-09 MED ORDER — CITALOPRAM HYDROBROMIDE 20 MG PO TABS
10.0000 mg | ORAL_TABLET | Freq: Every morning | ORAL | Status: DC
  Administered 2017-07-10 – 2017-07-11 (×2): 10 mg via ORAL
  Filled 2017-07-09: qty 1

## 2017-07-09 MED ORDER — METOCLOPRAMIDE HCL 5 MG PO TABS: 5.0000 mg | ORAL_TABLET | Freq: Three times a day (TID) | ORAL | Status: DC | PRN

## 2017-07-09 MED ORDER — PHENOL 1.4 % MT LIQD: 1.0000 | OROMUCOSAL | Status: DC | PRN

## 2017-07-09 MED ORDER — HYDROCODONE-ACETAMINOPHEN 7.5-325 MG PO TABS
1.0000 | ORAL_TABLET | ORAL | Status: DC | PRN
  Administered 2017-07-10 – 2017-07-11 (×2): 2 via ORAL
  Filled 2017-07-09 (×2): qty 2

## 2017-07-09 MED ORDER — BUPIVACAINE IN DEXTROSE 0.75-8.25 % IT SOLN
INTRATHECAL | Status: DC | PRN
  Administered 2017-07-09: 2 mL via INTRATHECAL

## 2017-07-09 MED ORDER — MIDAZOLAM HCL 2 MG/2ML IJ SOLN
INTRAMUSCULAR | Status: AC
  Filled 2017-07-09: qty 2

## 2017-07-09 MED ORDER — MENTHOL 3 MG MT LOZG: 1.0000 | LOZENGE | OROMUCOSAL | Status: DC | PRN

## 2017-07-09 MED ORDER — VITAMIN D3 25 MCG (1000 UNIT) PO TABS
5000.0000 [IU] | ORAL_TABLET | Freq: Every morning | ORAL | Status: DC
  Administered 2017-07-10 – 2017-07-11 (×2): 5000 [IU] via ORAL
  Filled 2017-07-09 (×2): qty 5

## 2017-07-09 MED ORDER — CEFAZOLIN SODIUM-DEXTROSE 2-4 GM/100ML-% IV SOLN
2.0000 g | Freq: Four times a day (QID) | INTRAVENOUS | Status: AC
  Administered 2017-07-09 (×2): 2 g via INTRAVENOUS
  Filled 2017-07-09 (×2): qty 100

## 2017-07-09 MED ORDER — METHOCARBAMOL 1000 MG/10ML IJ SOLN
500.0000 mg | Freq: Four times a day (QID) | INTRAVENOUS | Status: DC | PRN
  Filled 2017-07-09: qty 5

## 2017-07-09 MED ORDER — HYDROCHLOROTHIAZIDE 25 MG PO TABS
12.5000 mg | ORAL_TABLET | Freq: Every morning | ORAL | Status: DC
  Administered 2017-07-10 – 2017-07-11 (×2): 12.5 mg via ORAL
  Filled 2017-07-09: qty 1

## 2017-07-09 MED ORDER — METOCLOPRAMIDE HCL 5 MG/ML IJ SOLN: 5.0000 mg | Freq: Three times a day (TID) | INTRAMUSCULAR | Status: DC | PRN

## 2017-07-09 SURGICAL SUPPLY — 36 items
APL SKNCLS STERI-STRIP NONHPOA (GAUZE/BANDAGES/DRESSINGS)
BAG SPEC THK2 15X12 ZIP CLS (MISCELLANEOUS) ×1
BAG ZIPLOCK 12X15 (MISCELLANEOUS) ×2 IMPLANT
BENZOIN TINCTURE PRP APPL 2/3 (GAUZE/BANDAGES/DRESSINGS) IMPLANT
BLADE SAW SGTL 18X1.27X75 (BLADE) ×2 IMPLANT
BLADE SAW SGTL 18X1.27X75MM (BLADE) ×1
CAPT HIP TOTAL 2 ×2 IMPLANT
CLOSURE WOUND 1/2 X4 (GAUZE/BANDAGES/DRESSINGS)
COVER PERINEAL POST (MISCELLANEOUS) ×3 IMPLANT
COVER SURGICAL LIGHT HANDLE (MISCELLANEOUS) ×3 IMPLANT
DRAPE STERI IOBAN 125X83 (DRAPES) ×3 IMPLANT
DRAPE U-SHAPE 47X51 STRL (DRAPES) ×6 IMPLANT
DRSG AQUACEL AG ADV 3.5X10 (GAUZE/BANDAGES/DRESSINGS) ×3 IMPLANT
DURAPREP 26ML APPLICATOR (WOUND CARE) ×3 IMPLANT
ELECT REM PT RETURN 15FT ADLT (MISCELLANEOUS) ×3 IMPLANT
GAUZE XEROFORM 1X8 LF (GAUZE/BANDAGES/DRESSINGS) ×2 IMPLANT
GLOVE BIO SURGEON STRL SZ7.5 (GLOVE) ×3 IMPLANT
GLOVE BIOGEL PI IND STRL 8 (GLOVE) ×2 IMPLANT
GLOVE BIOGEL PI INDICATOR 8 (GLOVE) ×4
GLOVE ECLIPSE 8.0 STRL XLNG CF (GLOVE) ×3 IMPLANT
GOWN STRL REUS W/TWL XL LVL3 (GOWN DISPOSABLE) ×6 IMPLANT
HANDPIECE INTERPULSE COAX TIP (DISPOSABLE) ×3
HOLDER FOLEY CATH W/STRAP (MISCELLANEOUS) ×3 IMPLANT
PACK ANTERIOR HIP CUSTOM (KITS) ×3 IMPLANT
SET HNDPC FAN SPRY TIP SCT (DISPOSABLE) ×1 IMPLANT
STAPLER VISISTAT 35W (STAPLE) ×2 IMPLANT
STRIP CLOSURE SKIN 1/2X4 (GAUZE/BANDAGES/DRESSINGS) IMPLANT
SUT ETHIBOND NAB CT1 #1 30IN (SUTURE) ×3 IMPLANT
SUT MNCRL AB 4-0 PS2 18 (SUTURE) IMPLANT
SUT VIC AB 0 CT1 36 (SUTURE) ×3 IMPLANT
SUT VIC AB 1 CT1 36 (SUTURE) ×3 IMPLANT
SUT VIC AB 2-0 CT1 27 (SUTURE) ×6
SUT VIC AB 2-0 CT1 TAPERPNT 27 (SUTURE) ×2 IMPLANT
TRAY FOLEY CATH 14FRSI W/METER (CATHETERS) ×2 IMPLANT
TRAY FOLEY MTR SLVR 16FR STAT (SET/KITS/TRAYS/PACK) ×1 IMPLANT
YANKAUER SUCT BULB TIP 10FT TU (MISCELLANEOUS) ×3 IMPLANT

## 2017-07-09 NOTE — Op Note (Signed)
NAME: Shelby Day, Shelby Day MEDICAL RECORD [id number] ACCOUNT [id number] DATE OF BIRTH:1945/05/01 FACILITY: [facility] LOCATION: [facility] PHYSICIAN:CHRISTOPHER Kerry Fort, MD  OPERATIVE REPORT  DATE OF PROCEDURE:  07/09/2017  PREOPERATIVE DIAGNOSIS:  Primary osteoarthritis and degenerative joint disease, left hip.  POSTOPERATIVE DIAGNOSIS:  Primary osteoarthritis and degenerative joint disease, left hip.  PROCEDURE:  Left total hip arthroplasty through direct anterior approach.  IMPLANTS:  DePuy Sector Gription acetabular component size 52, size 36+0 neutral polyethylene liner, size 10 Corail femoral component with standard offset, size 36 -2 metal head ball.  SURGEON:  Jean Rosenthal, MD  ASSISTANT:  Erskine Emery, PA-C.  ANESTHESIA:  Spinal.  ANTIBIOTICS:  3 grams IV Ancef.  ESTIMATED BLOOD LOSS:  350 mL.  COMPLICATIONS:  None.  INDICATIONS:  The patient is a very pleasant, morbidly obese 72 year old female with debilitating arthritis involving her left hip.  She has tried and failed all forms of conservative treatment.  Her x-rays show significant loss and arthritis of the left  hip.  At this point, she does wish to proceed with a total hip arthroplasty, having failed all forms of conservative treatment.  She has worked on weight loss and activity modification as well.  I do feel comfortable proceeding with the surgery today.   She understands fully the risk of acute blood loss anemia, nerve or vessel injury, fracture, infection, dislocation, implant failure and DVT.  She understands our goals are to decrease pain and improve mobility and overall improved quality of life.  DESCRIPTION OF PROCEDURE:  After informed consent was obtained, the left hip was marked.  She was brought to the operating room where spinal anesthesia was obtained while she was on her stretcher.  She then had traction boots applied to on both of her  feet.  Foley catheter was placed and she was placed supine  on the Hana fracture table.  The perineal post was placed and place and both legs in the skeletal traction device and traction applied.  The left operative hip was prepped and draped with  DuraPrep and sterile drapes.  A timeout was called.  She was identified, correct patient, correct left hip.  We then made an incision just inferior and posterior to the anterior superior iliac spine and carried this obliquely down the leg.  We dissected  down to tensor fascia lata muscle.  The tensor fascia was then divided longitudinally to proceed with direct anterior approach to the hip.  We identified and cauterized.  Circumflex vessels were then identified the hip capsule over the hip capsule with  the aforementioned finding a moderate joint effusion and significant arthritis around her left hip.  We placed curb retractors around the medial and lateral femoral neck and then made our femoral neck cut with an oscillating saw and completed this with  an osteotome.  We placed a corkscrew guide in the femoral head and removed the femoral head in its entirety and found a large area devoid of cartilage.  I then placed a bent Hohmann over the medial acetabular rim and removed remnants of the acetabular  labrum and other debris.  We then began reaming from a size 43 reamer in stabilized increments up to a size 51 with all reamers under direct visualization with the last reamer under direct fluoroscopy, so we could obtain our depth in inclination and  anteversion.  Once I was pleased with this, I placed the real DePuy Sector Gription acetabular component size 52 and a 36+0 polyethylene liner for that size acetabular component.  Attention was then turned to the femur.  With the leg externally rotated  to 120 degrees, extended and adducted, we were able to place a medial retractor medially and a Homan retractor behind the greater trochanter.  We released the lateral joint capsule.  We used a box cutting osteotome to enter femoral  canal and a rongeur to  lateralize and then began broaching from a size 8 broach using the Corail system going up to size a 10.  The 10 had a nice fit in the canal.  We trialed the standard offset femoral neck and due to her tightness I went a 36 -2 hip ball.  We reduced this  in the acetabulum and I was actually pleased with leg length, offset, range of motion and stability.  We then dislocated the hip and removed the trial components.  I placed the real Corail femoral component with standard offset size 10 and the real 36 -2  metal hip ball.  Again reducing this in the acetabulum and I was pleased with stability.  We then irrigated the soft tissue with normal saline solution using pulsatile lavage.  I was able to close the joint capsule with interrupted #1 Ethibond suture,  followed by running 0 Vicryl in tensor fascia, 0 Vicryl in the deep tissue, 2-0 Vicryl subcutaneous tissue, interrupted staples on the skin.  Xeroform and Aquacel dressing was applied.  She was then taken off the Hana table and taken to recovery room in  stable condition.  All final counts were correct.  There were no complications noted.  Of note, Benita Stabile, PA-C assisted in the entire case.  His assistance was crucial in facilitating all aspects of this case.  TN/NUANCE  D:07/09/2017 T:07/09/2017 JOB:[id number]

## 2017-07-09 NOTE — Brief Op Note (Signed)
07/09/2017  12:42 PM  PATIENT:  Shelby Day  72 y.o. female  PRE-OPERATIVE DIAGNOSIS:  osteoarthritis left hip  POST-OPERATIVE DIAGNOSIS:  osteoarthritis left hip  PROCEDURE:  Procedure(s): LEFT TOTAL HIP ARTHROPLASTY ANTERIOR APPROACH (Left)  SURGEON:  Surgeon(s) and Role:    Mcarthur Rossetti, MD - Primary  PHYSICIAN ASSISTANT: Benita Stabile, PA-C  ANESTHESIA:   spinal  EBL:  400 mL   COUNTS:  YES  DICTATION: .Other Dictation: Dictation Number [id number]  PLAN OF CARE: Admit to inpatient   PATIENT DISPOSITION:  PACU - hemodynamically stable.   Delay start of Pharmacological VTE agent (>24hrs) due to surgical blood loss or risk of bleeding: no

## 2017-07-09 NOTE — Anesthesia Postprocedure Evaluation (Signed)
Anesthesia Post Note  Patient: KAIRY FOLSOM  Procedure(s) Performed: LEFT TOTAL HIP ARTHROPLASTY ANTERIOR APPROACH (Left Hip)     Patient location during evaluation: PACU Anesthesia Type: Spinal Level of consciousness: oriented and awake and alert Pain management: pain level controlled Vital Signs Assessment: post-procedure vital signs reviewed and stable Respiratory status: spontaneous breathing, respiratory function stable and patient connected to nasal cannula oxygen Cardiovascular status: blood pressure returned to baseline and stable Postop Assessment: no headache, no backache, no apparent nausea or vomiting, spinal receding and patient able to bend at knees Anesthetic complications: no    Last Vitals:  Vitals:   07/09/17 1444 07/09/17 1502  BP: 99/79 130/85  Pulse: 60 (!) 56  Resp: 16   Temp: 37.1 C   SpO2: 96%     Last Pain:  Vitals:   07/09/17 1400  TempSrc:   PainSc: 0-No pain                 Catalina Gravel

## 2017-07-09 NOTE — Anesthesia Procedure Notes (Signed)
Procedure Name: MAC Date/Time: 07/09/2017 11:34 AM Performed by: Dione Booze, CRNA Pre-anesthesia Checklist: Patient identified, Emergency Drugs available, Suction available and Patient being monitored Patient Re-evaluated:Patient Re-evaluated prior to induction Oxygen Delivery Method: Simple face mask Placement Confirmation: positive ETCO2

## 2017-07-09 NOTE — Anesthesia Procedure Notes (Signed)
Spinal  Patient location during procedure: OR Start time: 07/09/2017 11:25 AM End time: 07/09/2017 11:30 AM Staffing Anesthesiologist: Catalina Gravel, MD Performed: anesthesiologist  Preanesthetic Checklist Completed: patient identified, surgical consent, pre-op evaluation, timeout performed, IV checked, risks and benefits discussed and monitors and equipment checked Spinal Block Patient position: sitting Prep: site prepped and draped and DuraPrep Patient monitoring: continuous pulse ox and blood pressure Approach: midline Location: L3-4 Injection technique: single-shot Needle Needle type: Pencan  Needle gauge: 24 G Assessment Sensory level: T8 Additional Notes Functioning IV was confirmed and monitors were applied. Sterile prep and drape, including hand hygiene, mask and sterile gloves were used. The patient was positioned and the spine was prepped. The skin was anesthetized with lidocaine.  Free flow of clear CSF was obtained prior to injecting local anesthetic into the CSF.  The spinal needle aspirated freely following injection.  The needle was carefully withdrawn.  The patient tolerated the procedure well. Consent was obtained prior to procedure with all questions answered and concerns addressed. Risks including but not limited to bleeding, infection, nerve damage, paralysis, failed block, inadequate analgesia, allergic reaction, high spinal, itching and headache were discussed and the patient wished to proceed.   Hoy Morn, MD

## 2017-07-09 NOTE — Anesthesia Preprocedure Evaluation (Addendum)
Anesthesia Evaluation  Patient identified by MRN, date of birth, ID band Patient awake    Reviewed: Allergy & Precautions, NPO status , Patient's Chart, lab work & pertinent test results  Airway Mallampati: III  TM Distance: >3 FB Neck ROM: Full    Dental  (+) Dental Advisory Given, Chipped,    Pulmonary sleep apnea and Continuous Positive Airway Pressure Ventilation ,    Pulmonary exam normal breath sounds clear to auscultation       Cardiovascular hypertension, Pt. on medications Normal cardiovascular exam Rhythm:Regular Rate:Normal     Neuro/Psych PSYCHIATRIC DISORDERS Depression negative neurological ROS     GI/Hepatic Neg liver ROS, Diverticulitis    Endo/Other  Morbid obesity  Renal/GU negative Renal ROS     Musculoskeletal  (+) Arthritis , Osteoarthritis,    Abdominal   Peds  Hematology negative hematology ROS (+) Plt 236k   Anesthesia Other Findings Day of surgery medications reviewed with the patient.  Reproductive/Obstetrics                            Anesthesia Physical Anesthesia Plan  ASA: III  Anesthesia Plan: Spinal   Post-op Pain Management:    Induction:   PONV Risk Score and Plan: 2 and Ondansetron, Dexamethasone and Propofol infusion  Airway Management Planned: Natural Airway and Simple Face Mask  Additional Equipment:   Intra-op Plan:   Post-operative Plan:   Informed Consent: I have reviewed the patients History and Physical, chart, labs and discussed the procedure including the risks, benefits and alternatives for the proposed anesthesia with the patient or authorized representative who has indicated his/her understanding and acceptance.   Dental advisory given  Plan Discussed with: CRNA, Anesthesiologist and Surgeon  Anesthesia Plan Comments:         Anesthesia Quick Evaluation

## 2017-07-09 NOTE — H&P (Signed)
TOTAL HIP ADMISSION H&P  Patient is admitted for left total hip arthroplasty.  Subjective:  Chief Complaint: left hip pain  HPI: Shelby Day, 72 y.o. female, has a history of pain and functional disability in the left hip(s) due to arthritis and patient has failed non-surgical conservative treatments for greater than 12 weeks to include NSAID's and/or analgesics, corticosteriod injections, flexibility and strengthening excercises, use of assistive devices, weight reduction as appropriate and activity modification.  Onset of symptoms was gradual starting 2 years ago with gradually worsening course since that time.The patient noted no past surgery on the left hip(s).  Patient currently rates pain in the left hip at 10 out of 10 with activity. Patient has night pain, worsening of pain with activity and weight bearing, trendelenberg gait, pain that interfers with activities of daily living and pain with passive range of motion. Patient has evidence of subchondral cysts, subchondral sclerosis, periarticular osteophytes and joint space narrowing by imaging studies. This condition presents safety issues increasing the risk of falls.  There is no current active infection.  Patient Active Problem List   Diagnosis Date Noted  . Unilateral primary osteoarthritis, left hip 05/18/2016   Past Medical History:  Diagnosis Date  . Arthritis    end-stage left hip  . Diverticulitis 03/23/2007   Noted on CT pelvis: Mild to moderate of prox. sigmoid colon  . Diverticulosis of sigmoid colon 03/23/2007   Noted on CT pelvis  . History of cataract    Bilateral  . History of kidney stones   . Hypertension   . Sleep apnea    USES CPAP    Past Surgical History:  Procedure Laterality Date  . BLADDER TACH    . CATARACT EXTRACTION W/ INTRAOCULAR LENS  IMPLANT, BILATERAL  01/31/2014 and 02/07/2014  . REFRACTIVE SURGERY Bilateral    20+ years ago LASIX    Current Facility-Administered Medications  Medication  Dose Route Frequency Provider Last Rate Last Dose  . ceFAZolin (ANCEF) 3 g in dextrose 5 % 50 mL IVPB  3 g Intravenous On Call to OR Mcarthur Rossetti, MD      . tranexamic acid (CYKLOKAPRON) 1,000 mg in sodium chloride 0.9 % 100 mL IVPB  1,000 mg Intravenous On Call to [place], [place], MD       Current Outpatient Medications  Medication Sig Dispense Refill Last Dose  . Ascorbic Acid (VITAMIN C) 1000 MG tablet Take 1,000 mg by mouth every morning.     . calcium carbonate (OSCAL) 1500 (600 Ca) MG TABS tablet Take 600 mg of elemental calcium by mouth daily with breakfast.     . Cholecalciferol (VITAMIN D3) 5000 units TABS Take 5,000 Units by mouth every morning.     . citalopram (CELEXA) 10 MG tablet Take 10 mg by mouth every morning.   0 Taking  . Glucosamine-MSM-Hyaluronic Acd (JOINT HEALTH PO) Take 1 tablet by mouth every morning.     . hydrochlorothiazide (HYDRODIURIL) 25 MG tablet Take 12.5 mg by mouth every morning.    Taking  . Krill Oil 500 MG CAPS Take 500 mg by mouth every morning.     Marland Kitchen MELATONIN PO Take 0.5 tablets by mouth at bedtime as needed (sleep).     . Multiple Vitamin (MULTIVITAMIN WITH MINERALS) TABS tablet Take 2 tablets by mouth every morning.     . naproxen sodium (ALEVE) 220 MG tablet Take 220 mg by mouth 2 (two) times daily as needed (hip pain.).  Allergies  Allergen Reactions  . Codeine Nausea And Vomiting    Social History   Tobacco Use  . Smoking status: Never Smoker  . Smokeless tobacco: Never Used  Substance Use Topics  . Alcohol use: Yes    Comment: RARE    Family History  Problem Relation Age of Onset  . Breast cancer Sister        diagnosed in her early 83's     Review of Systems  Musculoskeletal: Positive for joint pain.  All other systems reviewed and are negative.   Objective:  Physical Exam  Constitutional: She is oriented to person, place, and time. She appears well-developed and well-nourished.  HENT:  Head:  Normocephalic and atraumatic.  Eyes: Pupils are equal, round, and reactive to light. EOM are normal.  Neck: Normal range of motion. Neck supple.  Cardiovascular: Normal rate and regular rhythm.  Respiratory: Effort normal and breath sounds normal.  GI: Soft. Bowel sounds are normal.  Musculoskeletal:       Left hip: She exhibits decreased range of motion, decreased strength, tenderness and bony tenderness.  Neurological: She is alert and oriented to person, place, and time.  Skin: Skin is warm and dry.  Psychiatric: She has a normal mood and affect.    Vital signs in last 24 hours:    Labs:   Estimated body mass index is 48.01 kg/m as calculated from the following:   Height as of 07/05/17: 5\' 3"  (1.6 m).   Weight as of 07/05/17: 271 lb (122.9 kg).   Imaging Review Plain radiographs demonstrate severe degenerative joint disease of the left hip(s). The bone quality appears to be good for age and reported activity level.    Preoperative templating of the joint replacement has been completed, documented, and submitted to the Operating Room personnel in order to optimize intra-operative equipment management.     Assessment/Plan:  End stage arthritis, left hip(s)  The patient history, physical examination, clinical judgement of the provider and imaging studies are consistent with end stage degenerative joint disease of the left hip(s) and total hip arthroplasty is deemed medically necessary. The treatment options including medical management, injection therapy, arthroscopy and arthroplasty were discussed at length. The risks and benefits of total hip arthroplasty were presented and reviewed. The risks due to aseptic loosening, infection, stiffness, dislocation/subluxation,  thromboembolic complications and other imponderables were discussed.  The patient acknowledged the explanation, agreed to proceed with the plan and consent was signed. Patient is being admitted for inpatient  treatment for surgery, pain control, PT, OT, prophylactic antibiotics, VTE prophylaxis, progressive ambulation and ADL's and discharge planning.The patient is planning to be discharged home with home health services

## 2017-07-09 NOTE — Transfer of Care (Signed)
Immediate Anesthesia Transfer of Care Note  Patient: Shelby Day  Procedure(s) Performed: LEFT TOTAL HIP ARTHROPLASTY ANTERIOR APPROACH (Left Hip)  Patient Location: PACU  Anesthesia Type:MAC and Spinal  Level of Consciousness: awake, alert  and patient cooperative  Airway & Oxygen Therapy: Patient Spontanous Breathing and Patient connected to face mask oxygen  Post-op Assessment: Report given to RN and Post -op Vital signs reviewed and stable  Post vital signs: Reviewed and stable  Last Vitals:  Vitals Value Taken Time  BP 114/72 07/09/2017  1:01 PM  Temp    Pulse 68 07/09/2017  1:02 PM  Resp 19 07/09/2017  1:02 PM  SpO2 98 % 07/09/2017  1:02 PM  Vitals shown include unvalidated device data.  Last Pain:  Vitals:   07/09/17 0940  TempSrc:   PainSc: 0-No pain      Patients Stated Pain Goal: 3 (53/61/44 3154)  Complications: No apparent anesthesia complications

## 2017-07-10 LAB — BASIC METABOLIC PANEL
ANION GAP: 7 (ref 5–15)
BUN: 16 mg/dL (ref 6–20)
CALCIUM: 8.8 mg/dL — AB (ref 8.9–10.3)
CO2: 27 mmol/L (ref 22–32)
Chloride: 108 mmol/L (ref 101–111)
Creatinine, Ser: 0.71 mg/dL (ref 0.44–1.00)
GFR calc Af Amer: >60 mL/min (ref >60)
Glucose, Bld: 133 mg/dL — ABNORMAL HIGH (ref 65–99)
Potassium: 4 mmol/L (ref 3.5–5.1)
SODIUM: 142 mmol/L (ref 135–145)

## 2017-07-10 LAB — CBC
HCT: 37.4 % (ref 36.0–46.0)
Hemoglobin: 13.1 g/dL (ref 12.0–15.0)
MCH: 34 pg (ref 26.0–34.0)
MCHC: 35 g/dL (ref 30.0–36.0)
MCV: 97.1 fL (ref 78.0–100.0)
PLATELETS: 228 10*3/uL (ref 150–400)
RBC: 3.85 MIL/uL — AB (ref 3.87–5.11)
RDW: 13 % (ref 11.5–15.5)
WBC: 12.9 10*3/uL — AB (ref 4.0–10.5)

## 2017-07-10 NOTE — Evaluation (Signed)
Physical Therapy Evaluation Patient Details Name: Shelby Day MRN: [id number] DOB: 09-Apr-1945 Today's Date: 07/10/2017   History of Present Illness  L DATHA  Clinical Impression  The patient is progressing well. Plans Dc tomorrow. Pt admitted with above diagnosis. Pt currently with functional limitations due to the deficits listed below (see PT Problem List).  Pt will benefit from skilled PT to increase their independence and safety with mobility to allow discharge to the venue listed below.       Follow Up Recommendations Follow surgeon's recommendation for DC plan and follow-up therapies;Home health PT    Equipment Recommendations  None recommended by PT    Recommendations for Other Services       Precautions / Restrictions Precautions Precautions: Fall Restrictions Weight Bearing Restrictions: No      Mobility  Bed Mobility Overal bed mobility: Needs Assistance Bed Mobility: Supine to Sit     Supine to sit: Min assist;HOB elevated        Transfers Overall transfer level: Needs assistance Equipment used: Rolling walker (2 wheeled) Transfers: Sit to/from Stand Sit to Stand: Min assist         General transfer comment: cues for hand and  left leg placement  Ambulation/Gait Ambulation/Gait assistance: Min assist Gait Distance (Feet): 180 Feet Assistive device: Rolling walker (2 wheeled) Gait Pattern/deviations: Step-to pattern;Step-through pattern     General Gait Details: cues for sequence and position inside the [facility]            Wheelchair Mobility    Modified Rankin (Stroke Patients Only)       Balance                                             Pertinent Vitals/Pain Pain Assessment: 0-10 Pain Score: 4  Pain Location: left thigh Pain Descriptors / Indicators: Tightness;Sore Pain Intervention(s): Limited activity within patient's tolerance    Home Living Family/patient expects to be discharged to::  Private residence Living Arrangements: Spouse/significant other Available Help at Discharge: Family Type of Home: House Home Access: Level entry       Home Equipment: Hospital bed Additional Comments: trapeze    Prior Function Level of Independence: Independent               Hand Dominance        Extremity/Trunk Assessment   Upper Extremity Assessment Upper Extremity Assessment: Overall WFL for tasks assessed    Lower Extremity Assessment Lower Extremity Assessment: LLE deficits/detail LLE Deficits / Details: flexes the hip in supine, advances with ambulation       Communication   Communication: No difficulties  Cognition Arousal/Alertness: Awake/alert Behavior During Therapy: WFL for tasks assessed/performed Overall Cognitive Status: Within Functional Limits for tasks assessed                                        General Comments      Exercises Total Joint Exercises Ankle Circles/Pumps: AROM;10 reps;Both Quad Sets: 10 reps;Both Short Arc Quad: AROM;Left;10 reps Heel Slides: AAROM;Left;10 reps Hip ABduction/ADduction: AAROM;Left;10 reps   Assessment/Plan    PT Assessment Patient needs continued PT services  PT Problem List Decreased strength;Decreased range of motion;Decreased activity tolerance;Decreased mobility;Decreased knowledge of precautions;Decreased safety awareness;Pain;Decreased knowledge of use of DME  PT Treatment Interventions DME instruction;Gait training;Functional mobility training;Therapeutic activities;Therapeutic exercise;Patient/family education    PT Goals (Current goals can be found in the Care Plan section)  Acute Rehab PT Goals Patient Stated Goal: to garden and  tie my shoes PT Goal Formulation: With patient/family Time For Goal Achievement: 07/13/17 Potential to Achieve Goals: Good    Frequency 7X/week   Barriers to discharge Decreased caregiver support      Co-evaluation                AM-PAC PT "6 Clicks" Daily Activity  Outcome Measure Difficulty turning over in bed (including adjusting bedclothes, sheets and blankets)?: A Lot Difficulty moving from lying on back to sitting on the side of the bed? : A Lot Difficulty sitting down on and standing up from a chair with arms (e.g., wheelchair, bedside commode, etc,.)?: A Lot Help needed moving to and from a bed to chair (including a wheelchair)?: A Lot Help needed walking in hospital room?: A Lot Help needed climbing 3-5 steps with a railing? : Total 6 Click Score: 11    End of Session   Activity Tolerance: Patient tolerated treatment well Patient left: in chair;with call bell/phone within reach Nurse Communication: Mobility status PT Visit Diagnosis: Difficulty in walking, not elsewhere classified (R26.2);Pain Pain - Right/Left: Left Pain - part of body: Hip    Time: 0350-0938 PT Time Calculation (min) (ACUTE ONLY): 52 min   Charges:   PT Evaluation $PT Eval Low Complexity: 1 Low PT Treatments $Gait Training: 8-22 mins $Therapeutic Exercise: 8-22 mins   PT G CodesTresa Endo PT [phone] e}  Claretha Cooper 07/10/2017, 10:14 AM

## 2017-07-10 NOTE — Discharge Instructions (Signed)

## 2017-07-10 NOTE — Evaluation (Signed)
Occupational Therapy Evaluation Patient Details Name: Shelby Day MRN: [id number] DOB: 06-27-1945 Today's Date: 07/10/2017    History of Present Illness L DATHA   Clinical Impression   Pt admitted with the above diagnoses and presents with below problem list. Pt will benefit from continued acute OT to address the below listed deficits and maximize independence with basic ADLs prior to d/c home. PTA pt was independent with ADLs. Pt is currently min A with LB ADLs and functional transfers.      Follow Up Recommendations  No OT follow up;Supervision - Intermittent    Equipment Recommendations  None recommended by OT    Recommendations for Other Services       Precautions / Restrictions Precautions Precautions: Fall Restrictions Weight Bearing Restrictions: No Other Position/Activity Restrictions: WBAT      Mobility Bed Mobility Overal bed mobility: Needs Assistance Bed Mobility: Supine to Sit     Supine to sit: Min assist;HOB elevated     General bed mobility comments: received in recliner  Transfers Overall transfer level: Needs assistance Equipment used: Rolling walker (2 wheeled) Transfers: Sit to/from Stand Sit to Stand: Min guard;Min assist         General transfer comment: cues for hand and  left leg placement; better with higher seat surfaces. successful on second attempt from recliner    Balance Overall balance assessment: Needs assistance         Standing balance support: Bilateral upper extremity supported;During functional activity Standing balance-Leahy Scale: Poor Standing balance comment: external support for balance                           ADL either performed or assessed with clinical judgement   ADL Overall ADL's : Needs assistance/impaired Eating/Feeding: Set up;Sitting   Grooming: Sitting;Min guard;Set up;Standing   Upper Body Bathing: Set up;Sitting   Lower Body Bathing: Minimal assistance;Sit to/from stand    Upper Body Dressing : Set up;Sitting   Lower Body Dressing: Minimal assistance;Sit to/from stand   Toilet Transfer: Ambulation;RW;Minimal Print production planner Details (indicate cue type and reason): 3n1 over toilet Toileting- Clothing Manipulation and Hygiene: Sit to/from stand;Minimal assistance;Min guard   Tub/ Shower Transfer: Walk-in shower;Min guard;Ambulation;3 in 1;Rolling walker Tub/Shower Transfer Details (indicate cue type and reason): practiced stepping into and out of shower Functional mobility during ADLs: Min guard;Rolling walker General ADL Comments: Pt completed simulated toilet transfer and shower transfer. Educated on techniques and AE for LB ADLs.      Vision         Perception     Praxis      Pertinent Vitals/Pain Pain Assessment: Faces Pain Score: 4  Faces Pain Scale: Hurts little more Pain Location: left thigh Pain Descriptors / Indicators: Tightness;Sore Pain Intervention(s): Monitored during session;Repositioned     Hand Dominance     Extremity/Trunk Assessment Upper Extremity Assessment Upper Extremity Assessment: Overall WFL for tasks assessed   Lower Extremity Assessment Lower Extremity Assessment: Defer to PT evaluation LLE Deficits / Details: flexes the hip in supine, advances with ambulation       Communication Communication Communication: No difficulties   Cognition Arousal/Alertness: Awake/alert Behavior During Therapy: WFL for tasks assessed/performed Overall Cognitive Status: Within Functional Limits for tasks assessed                                     General Comments  Shoulder Instructions      Home Living Family/patient expects to be discharged to:: Private residence Living Arrangements: Spouse/significant other Available Help at Discharge: Family Type of Home: House Home Access: Level entry           Bathroom Shower/Tub: Walk-in Corporate treasurer Toilet: [place]: Environmental consultant - 2 wheels;Grab bars - tub/shower;Hospital bed;Shower seat;Toilet riser   Additional Comments: trapeze      Prior Functioning/Environment Level of Independence: Independent                 OT Problem List: Impaired balance (sitting and/or standing);Decreased knowledge of use of DME or AE;Decreased knowledge of precautions;Pain      OT Treatment/Interventions: Self-care/ADL training;DME and/or AE instruction;Therapeutic activities;Patient/family education;Balance training    OT Goals(Current goals can be found in the care plan section) Acute Rehab OT Goals Patient Stated Goal: to garden and  tie my shoes OT Goal Formulation: With patient Time For Goal Achievement: 07/17/17 Potential to Achieve Goals: Good ADL Goals Pt Will Perform Lower Body Bathing: with adaptive equipment;with modified independence;sit to/from stand Pt Will Perform Lower Body Dressing: with modified independence;sit to/from stand;with adaptive equipment Pt Will Transfer to Toilet: with supervision;ambulating Pt Will Perform Toileting - Clothing Manipulation and hygiene: with supervision;sit to/from stand Pt Will Perform Tub/Shower Transfer: Shower transfer;with supervision;ambulating;3 in 1;rolling walker  OT Frequency: Min 2X/week   Barriers to D/C:            Co-evaluation              AM-PAC PT "6 Clicks" Daily Activity     Outcome Measure Help from another person eating meals?: None Help from another person taking care of personal grooming?: None Help from another person toileting, which includes using toliet, bedpan, or urinal?: A Little Help from another person bathing (including washing, rinsing, drying)?: A Little Help from another person to put on and taking off regular upper body clothing?: None Help from another person to put on and taking off regular lower body clothing?: A Little 6 Click Score: 21   End of Session Equipment Utilized During Treatment: Rolling  walker  Activity Tolerance: Patient tolerated treatment well Patient left: in chair;with call bell/phone within reach  OT Visit Diagnosis: Pain;Other abnormalities of gait and mobility (R26.89) Pain - Right/Left: Left Pain - part of body: Hip                Time: 4665-9935 OT Time Calculation (min): 25 min Charges:  OT General Charges $OT Visit: 1 Visit OT Evaluation $OT Eval Low Complexity: 1 Low OT Treatments $Self Care/Home Management : 8-22 mins G-Codes:       Hortencia Pilar 07/10/2017, 11:04 AM

## 2017-07-10 NOTE — Progress Notes (Signed)
Physical Therapy Treatment Patient Details Name: BLONNIE MASKE MRN: [id number] DOB: [birth year] Today's Date: 07/10/2017    History of Present Illness L DATHA    PT Comments    Patient is progressing well. Some difficulty getting onto bed. Plans Dc tomorrow after PT. Has 1 STE.    Follow Up Recommendations  Follow surgeon's recommendation for DC plan and follow-up therapies;Home health PT     Equipment Recommendations  None recommended by PT    Recommendations for Other Services       Precautions / Restrictions Precautions Precautions: Fall Restrictions Other Position/Activity Restrictions: WBAT    Mobility  Bed Mobility Overal bed mobility: Needs Assistance Bed Mobility: Sit to Supine     Supine to sit: HOB elevated Sit to supine: Min assist   General bed mobility comments: attempted to use the belt  and right foot to  self assist left leg onto bed, required some external assist.  Transfers Overall transfer level: Needs assistance Equipment used: Rolling walker (2 wheeled) Transfers: Sit to/from Stand Sit to Stand: Supervision         General transfer comment: cues for hand and  left leg placement;  Ambulation/Gait Ambulation/Gait assistance: Min guard Gait Distance (Feet): 300 Feet Assistive device: Rolling walker (2 wheeled) Gait Pattern/deviations: Step-through pattern;Step-to pattern     General Gait Details: cues for sequence   Stairs             Wheelchair Mobility    Modified Rankin (Stroke Patients Only)       Balance                                            Cognition Arousal/Alertness: Awake/alert                                            Exercises      General Comments        Pertinent Vitals/Pain Faces Pain Scale: Hurts little more Pain Location: left thigh Pain Descriptors / Indicators: Tightness;Sore Pain Intervention(s): Monitored during session    Home Living                       Prior Function            PT Goals (current goals can now be found in the care plan section) Progress towards PT goals: Progressing toward goals    Frequency           PT Plan Current plan remains appropriate    Co-evaluation              AM-PAC PT "6 Clicks" Daily Activity  Outcome Measure  Difficulty turning over in bed (including adjusting bedclothes, sheets and blankets)?: A Lot Difficulty moving from lying on back to sitting on the side of the bed? : A Lot Difficulty sitting down on and standing up from a chair with arms (e.g., wheelchair, bedside commode, etc,.)?: A Little Help needed moving to and from a bed to chair (including a wheelchair)?: A Little Help needed walking in hospital room?: A Little Help needed climbing 3-5 steps with a railing? : Total 6 Click Score: 14    End of Session   Activity Tolerance: Patient tolerated treatment well Patient left:  in bed;with call bell/phone within reach;with family/visitor present Nurse Communication: Mobility status PT Visit Diagnosis: Difficulty in walking, not elsewhere classified (R26.2);Pain Pain - Right/Left: Left Pain - part of body: Hip     Time: 1415-1435 PT Time Calculation (min) (ACUTE ONLY): 20 min  Charges:  $Gait Training: 8-22 mins                    G CodesTresa Endo PT [phone]    Claretha Cooper 07/10/2017, 2:50 PM

## 2017-07-10 NOTE — Progress Notes (Signed)
Subjective: 1 Day Post-Op Procedure(s) (LRB): LEFT TOTAL HIP ARTHROPLASTY ANTERIOR APPROACH (Left) Patient reports pain as moderate.    Objective: Vital signs in last 24 hours: Temp:  [97.7 F (36.5 C)-98.7 F (37.1 C)] 97.8 F (36.6 C) (06/22 0555) Pulse Rate:  [50-76] 53 (06/22 0555) Resp:  [14-19] 16 (06/22 0555) BP: (99-159)/(66-99) 109/70 (06/22 0555) SpO2:  [95 %-100 %] 95 % (06/22 0555) Weight:  [271 lb (122.9 kg)] 271 lb (122.9 kg) (06/21 0940)  Intake/Output from previous day: 06/21 0701 - 06/22 0700 In: 4109.6 [P.O.:1370; I.V.:2479.6; IV Piggyback:260] Out: 2060 [Urine:1660; Blood:400] Intake/Output this shift: No intake/output data recorded.  Recent Labs    07/10/17 0541  HGB 13.1   Recent Labs    07/10/17 0541  WBC 12.9*  RBC 3.85*  HCT 37.4  PLT 228   Recent Labs    07/10/17 0541  NA 142  K 4.0  CL 108  CO2 27  BUN 16  CREATININE 0.71  GLUCOSE 133*  CALCIUM 8.8*   No results for input(s): LABPT, INR in the last 72 hours.  Sensation intact distally Intact pulses distally Dorsiflexion/Plantar flexion intact Incision: scant drainage   Assessment/Plan: 1 Day Post-Op Procedure(s) (LRB): LEFT TOTAL HIP ARTHROPLASTY ANTERIOR APPROACH (Left) Up with therapy Plan for discharge tomorrow Discharge home with home health    Mcarthur Rossetti 07/10/2017, 9:09 AM

## 2017-07-10 NOTE — Care Management Note (Signed)
Case Management Note  Patient Details  Name: Shelby Day MRN: [id number] Date of Birth: [birth year]  Subjective/Objective:  Left DATHA                  Action/Plan: NCM spoke to pt and offered choice for [facility]. Pt agreeable to [facility]. (preoperatively arranged by surgeon's office). Pt states she has RW and 3n1 at home.   Expected Discharge Date:  07/11/17               Expected Discharge Plan:  [place]  In-House Referral:  NA  Discharge planning Services  CM Consult  Post Acute Care Choice:  Home Health Choice offered to:  Patient  DME Arranged:  N/A DME Agency:  NA  HH Arranged:  PT [place] Agency:  [facility] (formerly [employer])  Status of Service:  Completed, signed off  If discussed at [employer], dates discussed:    Additional Comments:  Erenest Rasher, RN 07/10/2017, 3:16 PM

## 2017-07-11 MED ORDER — METHOCARBAMOL 500 MG PO TABS: 500.0000 mg | ORAL_TABLET | Freq: Four times a day (QID) | ORAL | 0 refills | Status: AC | PRN

## 2017-07-11 MED ORDER — ASPIRIN 81 MG PO CHEW: 81.0000 mg | CHEWABLE_TABLET | Freq: Two times a day (BID) | ORAL | 0 refills | Status: DC

## 2017-07-11 MED ORDER — HYDROCODONE-ACETAMINOPHEN 5-325 MG PO TABS: 1.0000 | ORAL_TABLET | ORAL | 0 refills | Status: DC | PRN

## 2017-07-11 NOTE — Progress Notes (Signed)
Discharged from floor via w/c for transport home by car. Belongings & spouse with pt. No changes in assessment. Erich Kochan  

## 2017-07-11 NOTE — Progress Notes (Signed)
Physical Therapy Treatment Patient Details Name: Shelby Day MRN: [id number] DOB: 1945/10/19 Today's Date: 07/11/2017    History of Present Illness L DATHA    PT Comments     patient is progressing well. More stiffness today in the left hip. Ready for DC.   Follow Up Recommendations  Follow surgeon's recommendation for DC plan and follow-up therapies;Home health PT     Equipment Recommendations  None recommended by PT    Recommendations for Other Services       Precautions / Restrictions Precautions Precautions: Fall Restrictions Other Position/Activity Restrictions: WBAT    Mobility  Bed Mobility Overal bed mobility: Needs Assistance Bed Mobility: Supine to Sit     Supine to sit: Mod assist;HOB elevated     General bed mobility comments: having more stiffness in left leg, required more assist for mobility to sitting.  Transfers   Equipment used: Rolling walker (2 wheeled) Transfers: Sit to/from Stand Sit to Stand: Supervision         General transfer comment: cues for hand and  left leg placement;  Ambulation/Gait Ambulation/Gait assistance: Min guard Gait Distance (Feet): 200 Feet Assistive device: Rolling walker (2 wheeled) Gait Pattern/deviations: Step-through pattern;Step-to pattern     General Gait Details: cues for sequence   Stairs Stairs: Yes Stairs assistance: Min assist Stair Management: Step to pattern;Forwards;With walker Number of Stairs: 1     Wheelchair Mobility    Modified Rankin (Stroke Patients Only)       Balance                                            Cognition Arousal/Alertness: Awake/alert                                            Exercises Total Joint Exercises Ankle Circles/Pumps: AROM;10 reps;Both Quad Sets: 10 reps;Both;AROM Short Arc Quad: AROM;Left;10 reps Heel Slides: AAROM;Left;10 reps Hip ABduction/ADduction: AAROM;Left;10 reps Long Arc Quad:  AROM;Left;10 reps;Seated    General Comments        Pertinent Vitals/Pain Faces Pain Scale: Hurts little more Pain Location: left thigh Pain Descriptors / Indicators: Tightness;Sore Pain Intervention(s): Patient requesting pain meds-RN notified;RN gave pain meds during session;Premedicated before session;Ice applied    Home Living                      Prior Function            PT Goals (current goals can now be found in the care plan section) Progress towards PT goals: Progressing toward goals    Frequency    7X/week      PT Plan Current plan remains appropriate    Co-evaluation              AM-PAC PT "6 Clicks" Daily Activity  Outcome Measure  Difficulty turning over in bed (including adjusting bedclothes, sheets and blankets)?: A Lot Difficulty moving from lying on back to sitting on the side of the bed? : A Lot Difficulty sitting down on and standing up from a chair with arms (e.g., wheelchair, bedside commode, etc,.)?: A Little Help needed moving to and from a bed to chair (including a wheelchair)?: A Little Help needed walking in hospital room?: A Little Help needed  climbing 3-5 steps with a railing? : A Lot 6 Click Score: 15    End of Session   Activity Tolerance: Patient tolerated treatment well Patient left: in chair;with call bell/phone within reach Nurse Communication: Mobility status PT Visit Diagnosis: Difficulty in walking, not elsewhere classified (R26.2);Pain Pain - Right/Left: Left Pain - part of body: Hip     Time: 1024-1101 PT Time Calculation (min) (ACUTE ONLY): 37 min  Charges:  $Gait Training: 8-22 mins $Therapeutic Exercise: 8-22 mins                    G Codes:          Claretha Cooper 07/11/2017, 1:38 PM

## 2017-07-11 NOTE — Progress Notes (Signed)
Patient ID: Shelby Day, female   DOB: 18-Apr-1945, 72 y.o.   MRN: [id number] Doing well overall.  Hip stable. Vitals stable.  Can be discharged to home today.

## 2017-07-12 ENCOUNTER — Telehealth (INDEPENDENT_AMBULATORY_CARE_PROVIDER_SITE_OTHER): Payer: Self-pay | Admitting: Orthopaedic Surgery

## 2017-07-12 ENCOUNTER — Encounter (HOSPITAL_COMMUNITY): Payer: Self-pay | Admitting: Orthopaedic Surgery

## 2017-07-12 NOTE — Discharge Summary (Signed)
Patient ID: Shelby Day MRN: [id number] DOB/AGE: 71-28-1947 71 y.o.  Admit date: 07/09/2017 Discharge date: 07/12/2017  Admission Diagnoses:  Principal Problem:   Unilateral primary osteoarthritis, left hip Active Problems:   Status post total replacement of left hip   Discharge Diagnoses:  Same  Past Medical History:  Diagnosis Date  . Arthritis    end-stage left hip  . Diverticulitis 03/23/2007   Noted on CT pelvis: Mild to moderate of prox. sigmoid colon  . Diverticulosis of sigmoid colon 03/23/2007   Noted on CT pelvis  . History of cataract    Bilateral  . History of kidney stones   . Hypertension   . Sleep apnea    USES CPAP    Surgeries: Procedure(s): LEFT TOTAL HIP ARTHROPLASTY ANTERIOR APPROACH on 07/09/2017   Consultants:   Discharged Condition: Improved  Hospital Course: Shelby Day is an 72 y.o. female who was admitted 07/09/2017 for operative treatment ofUnilateral primary osteoarthritis, left hip. Patient has severe unremitting pain that affects sleep, daily activities, and work/hobbies. After pre-op clearance the patient was taken to the operating room on 07/09/2017 and underwent  Procedure(s): LEFT TOTAL HIP ARTHROPLASTY ANTERIOR APPROACH.    Patient was given perioperative antibiotics:  Anti-infectives (From admission, onward)   Start     Dose/Rate Route Frequency Ordered Stop   07/09/17 1800  ceFAZolin (ANCEF) IVPB 2g/100 mL premix     2 g 200 mL/hr over 30 Minutes Intravenous Every 6 hours 07/09/17 1450 07/10/17 0004   07/09/17 0600  ceFAZolin (ANCEF) 3 g in dextrose 5 % 50 mL IVPB     3 g 100 mL/hr over 30 Minutes Intravenous On call to O.R. 07/08/17 1138 07/09/17 1206       Patient was given sequential compression devices, early ambulation, and chemoprophylaxis to prevent DVT.  Patient benefited maximally from hospital stay and there were no complications.    Recent vital signs: No data found.   Recent laboratory studies:  Recent  Labs    07/10/17 0541  WBC 12.9*  HGB 13.1  HCT 37.4  PLT 228  NA 142  K 4.0  CL 108  CO2 27  BUN 16  CREATININE 0.71  GLUCOSE 133*  CALCIUM 8.8*     Discharge Medications:   Allergies as of 07/11/2017      Reactions   Codeine Nausea And Vomiting      Medication List    STOP taking these medications   naproxen sodium 220 MG tablet Commonly known as:  ALEVE     TAKE these medications   aspirin 81 MG chewable tablet Chew 1 tablet (81 mg total) by mouth 2 (two) times daily.   calcium carbonate 1500 (600 Ca) MG Tabs tablet Commonly known as:  OSCAL Take 600 mg of elemental calcium by mouth daily with breakfast.   citalopram 10 MG tablet Commonly known as:  CELEXA Take 10 mg by mouth every morning.   hydrochlorothiazide 25 MG tablet Commonly known as:  HYDRODIURIL Take 12.5 mg by mouth every morning.   HYDROcodone-acetaminophen 5-325 MG tablet Commonly known as:  NORCO/VICODIN Take 1-2 tablets by mouth every 4 (four) hours as needed for moderate pain (pain score 4-6).   JOINT HEALTH PO Take 1 tablet by mouth every morning.   Krill Oil 500 MG Caps Take 500 mg by mouth every morning.   MELATONIN PO Take 0.5 tablets by mouth at bedtime as needed (sleep).   methocarbamol 500 MG tablet Commonly known as:  ROBAXIN  Take 1 tablet (500 mg total) by mouth every 6 (six) hours as needed for muscle spasms.   multivitamin with minerals Tabs tablet Take 2 tablets by mouth every morning.   vitamin C 1000 MG tablet Take 1,000 mg by mouth every morning.   Vitamin D3 5000 units Tabs Take 5,000 Units by mouth every morning.       Diagnostic Studies: Dg Pelvis Portable  Result Date: 07/09/2017 CLINICAL DATA:  Status post hip replacement. EXAM: PORTABLE PELVIS 1-2 VIEWS COMPARISON:  07/09/2017. FINDINGS: Total left hip replacement. Hardware intact. Anatomic alignment. No acute bony abnormality. IMPRESSION: Total left hip replacement. Anatomic alignment. No acute bony  abnormality. Electronically Signed   By: Marcello Moores  Register   On: 07/09/2017 13:41   Dg C-arm 1-60 Min-no Report  Result Date: 07/09/2017 Fluoroscopy was utilized by the requesting physician.  No radiographic interpretation.   Dg Hip Operative Unilat With Pelvis Left  Result Date: 07/09/2017 CLINICAL DATA:  Intraoperative imaging for left hip replacement. EXAM: OPERATIVE LEFT HIP (WITH PELVIS IF PERFORMED) 2 VIEWS TECHNIQUE: Fluoroscopic spot image(s) were submitted for interpretation post-operatively. COMPARISON:  Plain films left hip from [facility] 04/28/2017. FINDINGS: Two fluoroscopic spot views of the left hip and low pelvis demonstrate a left total hip arthroplasty in place. The device is located. No acute abnormality. IMPRESSION: Intraoperative imaging for left hip replacement. Electronically Signed   By: Inge Rise M.D.   On: 07/09/2017 12:55    Disposition: Discharge disposition: 01-Home or Self Care       Discharge Instructions    Discharge patient   Complete by:  As directed    Discharge disposition:  01-Home or Self Care   Discharge patient date:  07/11/2017      Follow-up Information    Mcarthur Rossetti, MD Follow up in 2 week(s).   Specialty:  Orthopedic Surgery Contact information: [place] [address] [phone]        [facility].   Specialty:  [place] Why:  [place] will call to arrange appointment Contact information: [place] [address] [phone]            Signed: Mcarthur Rossetti 07/12/2017, 7:25 AM

## 2017-07-12 NOTE — Telephone Encounter (Signed)
Verbal order given  

## 2017-07-12 NOTE — Telephone Encounter (Signed)
Gennaro Africa, PT [facility] is requesting physical therapy verbal orders  3 x for 2 weeks 1 x for 1 week.  CB # [phone]

## 2017-07-13 ENCOUNTER — Telehealth (INDEPENDENT_AMBULATORY_CARE_PROVIDER_SITE_OTHER): Payer: Self-pay

## 2017-07-13 NOTE — Telephone Encounter (Signed)
Patient called states she doesn't have a lot of control with her bladder, "leaking" is the word she used. Wants to know is this something that happens?

## 2017-07-13 NOTE — Telephone Encounter (Signed)
It can short-term since she did have a foley cath while she was in the hospital.  She may want to see her GYN MD, Urologist, or PCP if it persists

## 2017-07-13 NOTE — Telephone Encounter (Signed)
Patient aware of the below message  

## 2017-07-26 ENCOUNTER — Ambulatory Visit (INDEPENDENT_AMBULATORY_CARE_PROVIDER_SITE_OTHER): Payer: Medicare Other | Admitting: Orthopaedic Surgery

## 2017-07-26 ENCOUNTER — Encounter (INDEPENDENT_AMBULATORY_CARE_PROVIDER_SITE_OTHER): Payer: Self-pay | Admitting: Orthopaedic Surgery

## 2017-07-26 DIAGNOSIS — Z96642 Presence of left artificial hip joint: Secondary | ICD-10-CM

## 2017-07-26 NOTE — Progress Notes (Signed)
The patient is 2 weeks status post a left total hip Arnell Sieving plasty direct injury approach.  She is doing well overall.  She is using a cane.  She takes her pain medication minimally.  She is 72 years old with a BMI of 48.  On exam her left hip incision looks good.  Remove the staples and placed Steri-Strips.  There is no significant seroma or redness at all.  There is no breakdown.  She understands the importance of taking care of the incision in the groin crease.  I placed Steri-Strips today and will have her place a gauze in her groin fold daily for the next few weeks.  All question concerns were answered and addressed.  We will see her back in about 6 weeks to see how she is doing overall but no x-rays are needed.

## 2017-08-19 ENCOUNTER — Other Ambulatory Visit (INDEPENDENT_AMBULATORY_CARE_PROVIDER_SITE_OTHER): Payer: Self-pay

## 2017-08-19 ENCOUNTER — Telehealth (INDEPENDENT_AMBULATORY_CARE_PROVIDER_SITE_OTHER): Payer: Self-pay | Admitting: Orthopaedic Surgery

## 2017-08-19 MED ORDER — AMOXICILLIN 500 MG PO TABS: ORAL_TABLET | ORAL | 0 refills | Status: DC

## 2017-08-19 NOTE — Telephone Encounter (Signed)
Patient called to inquire about antibiotics for dental appt she has for 8/7. She has had a hip replacement on 07/09/17 and was told by dental office she needed this antibiotic.

## 2017-08-19 NOTE — Progress Notes (Signed)
amox

## 2017-08-19 NOTE — Telephone Encounter (Signed)
Called antibiotic in for patient

## 2017-09-06 ENCOUNTER — Ambulatory Visit (INDEPENDENT_AMBULATORY_CARE_PROVIDER_SITE_OTHER): Payer: Medicare Other | Admitting: Orthopaedic Surgery

## 2017-09-08 ENCOUNTER — Encounter (INDEPENDENT_AMBULATORY_CARE_PROVIDER_SITE_OTHER): Payer: Self-pay | Admitting: Orthopaedic Surgery

## 2017-09-08 ENCOUNTER — Ambulatory Visit (INDEPENDENT_AMBULATORY_CARE_PROVIDER_SITE_OTHER): Payer: Medicare Other | Admitting: Orthopaedic Surgery

## 2017-09-08 DIAGNOSIS — Z96642 Presence of left artificial hip joint: Secondary | ICD-10-CM

## 2017-09-08 NOTE — Progress Notes (Signed)
The patient is 6 weeks status post a left total hip arthroplasty.  She says she is doing well.  She has good range of motion strength.  She still has residual numbness in her incision.  She is very fit and young appearing 72 years old.  She is moderately obese.  She does have some bilateral knee swelling and leg swelling but overall feels like she is doing well.  On examination she moves her left hip around nice and normal.  She does have some subjective numbness around her incision but it looks good.  Her knees and legs are slightly swollen but her calves are soft.  This point she will continue increase her activities.  I talked about things she can and cannot do.  We will see her back in 6 months and at that visit I would like a low AP pelvis and lateral of her left operative hip.  If there is any issues before then she will let us know.

## 2018-03-09 ENCOUNTER — Encounter (INDEPENDENT_AMBULATORY_CARE_PROVIDER_SITE_OTHER): Payer: Self-pay | Admitting: Orthopaedic Surgery

## 2018-03-09 ENCOUNTER — Ambulatory Visit (INDEPENDENT_AMBULATORY_CARE_PROVIDER_SITE_OTHER): Payer: Self-pay

## 2018-03-09 ENCOUNTER — Ambulatory Visit (INDEPENDENT_AMBULATORY_CARE_PROVIDER_SITE_OTHER): Payer: Medicare Other | Admitting: Orthopaedic Surgery

## 2018-03-09 DIAGNOSIS — Z96642 Presence of left artificial hip joint: Secondary | ICD-10-CM

## 2018-03-09 NOTE — Progress Notes (Signed)
Office Visit Note   Patient: Shelby Day           Date of Birth: [birth year]           MRN: [id number] Visit Date: 03/09/2018              Requested by: Leighton Ruff, MD [place], [place] [address] PCP: Leighton Ruff, MD   Assessment & Plan: Visit Diagnoses:  1. History of left hip replacement     Plan: She will continue to work on range of motion strengthening of the left hip.  Offered an injection for left trochanteric bursitis today she defers.  Showed her IT band stretching exercises.  She will follow-up with [place] on as-needed basis pain persist in regards to the left trochanteric bursitis.  Or if she has any questions or concerns.  Follow-Up Instructions: Return if symptoms worsen or fail to improve.   Orders:  Orders Placed This Encounter  Procedures  . XR HIP UNILAT W OR W/O PELVIS 1V LEFT   No orders of the defined types were placed in this encounter.     Procedures: No procedures performed   Clinical Data: No additional findings.   Subjective: Chief Complaint  Patient presents with  . Left Hip - Follow-up    HPI Mrs. Shelby Day comes in today for follow-up now 8 months status post left total hip arthroplasty.  She is overall doing well.  She is still having trouble putting on her shoes question if she needs more therapy or what can be done.  She did like to have more range of motion.  She also has pain over the lateral aspect of the left hip at times particularly whenever lying on the hip.  No radicular symptoms down the leg. Review of Systems See HPI otherwise negative  Objective: Vital Signs: There were no vitals taken for this visit.  Physical Exam Constitutional:      Appearance: She is not ill-appearing or diaphoretic.  Pulmonary:     Effort: Pulmonary effort is normal.  Neurological:     Mental Status: She is alert and oriented to person, place, and time.     Ortho Exam Left hip overall good range of motion.   Tenderness over the left trochanter enteric region.  Ambulates without any assistive device.  Gets up and down on her and seated position. Specialty Comments:  No specialty comments available.  Imaging: Xr Hip Unilat W Or W/o Pelvis 1v Left  Result Date: 03/09/2018 AP pelvis lateral view left hip: Left total hip components well-seated.  No acute fractures.  Degenerative changes of the right hip noted.    PMFS History: Patient Active Problem List   Diagnosis Date Noted  . Status post total replacement of left hip 07/09/2017  . Unilateral primary osteoarthritis, left hip 05/18/2016   Past Medical History:  Diagnosis Date  . Arthritis    end-stage left hip  . Diverticulitis 03/23/2007   Noted on CT pelvis: Mild to moderate of prox. sigmoid colon  . Diverticulosis of sigmoid colon 03/23/2007   Noted on CT pelvis  . History of cataract    Bilateral  . History of kidney stones   . Hypertension   . Sleep apnea    USES CPAP    Family History  Problem Relation Age of Onset  . Breast cancer Sister        diagnosed in her early 23's    Past Surgical History:  Procedure Laterality Date  .  BLADDER TACH    . CATARACT EXTRACTION W/ INTRAOCULAR LENS  IMPLANT, BILATERAL  01/31/2014 and 02/07/2014  . REFRACTIVE SURGERY Bilateral    20+ years ago LASIX  . TOTAL HIP ARTHROPLASTY Left 07/09/2017   Procedure: LEFT TOTAL HIP ARTHROPLASTY ANTERIOR APPROACH;  Surgeon: Mcarthur Rossetti, MD;  Location: [facility];  Service: Orthopedics;  Laterality: Left;   Social History   Occupational History  . Not on file  Tobacco Use  . Smoking status: Never Smoker  . Smokeless tobacco: Never Used  Substance and Sexual Activity  . Alcohol use: Yes    Comment: RARE  . Drug use: Never  . Sexual activity: Not on file

## 2018-06-22 ENCOUNTER — Encounter: Payer: Self-pay | Admitting: Physician Assistant

## 2018-06-22 ENCOUNTER — Ambulatory Visit (INDEPENDENT_AMBULATORY_CARE_PROVIDER_SITE_OTHER): Payer: Medicare Other

## 2018-06-22 ENCOUNTER — Other Ambulatory Visit: Payer: Self-pay

## 2018-06-22 ENCOUNTER — Ambulatory Visit: Payer: Medicare Other | Admitting: Physician Assistant

## 2018-06-22 DIAGNOSIS — M25561 Pain in right knee: Secondary | ICD-10-CM

## 2018-06-22 DIAGNOSIS — M1711 Unilateral primary osteoarthritis, right knee: Secondary | ICD-10-CM | POA: Diagnosis not present

## 2018-06-22 MED ORDER — METHYLPREDNISOLONE ACETATE 40 MG/ML IJ SUSP
40.0000 mg | INTRAMUSCULAR | Status: AC | PRN
  Administered 2018-06-22: 40 mg via INTRA_ARTICULAR

## 2018-06-22 MED ORDER — LIDOCAINE HCL 1 % IJ SOLN
3.0000 mL | INTRAMUSCULAR | Status: AC | PRN
  Administered 2018-06-22: 3 mL

## 2018-06-22 NOTE — Progress Notes (Signed)
Office Visit Note   Patient: Shelby Day           Date of Birth: October 25, 1945           MRN: [id number] Visit Date: 06/22/2018              Requested by: Leighton Ruff, MD [place], [place] [address] PCP: Leighton Ruff, MD   Assessment & Plan: Visit Diagnoses:  1. Acute pain of right knee   2. Primary osteoarthritis of right knee     Plan: Recommend she work on quad strengthening both knees.  She understands that she can have cortisone injections in her knees no more often than every 3 months.  I did talk with her about supplemental injections or something else we could try.  She will continue her glucosamine chondroitin.  Follow-up with [place] on as-needed basis pain persist or comes worse.  Questions encouraged and answered.  The knee was right wrapped with an Ace wrap which she will remove this evening.  Follow-Up Instructions: Return if symptoms worsen or fail to improve.   Orders:  Orders Placed This Encounter  Procedures  . XR Knee 1-2 Views Right   No orders of the defined types were placed in this encounter.     Procedures: Large Joint Inj: R knee on 06/22/2018 11:49 AM Indications: pain Details: 22 G 1.5 in needle, anterolateral approach  Arthrogram: No  Medications: 3 mL lidocaine 1 %; 40 mg methylPREDNISolone acetate 40 MG/ML Aspirate: 25 mL yellow Outcome: tolerated well, no immediate complications Procedure, treatment alternatives, risks and benefits explained, specific risks discussed. Consent was given by the patient. Immediately prior to procedure a time out was called to verify the correct patient, procedure, equipment, support staff and site/side marked as required. Patient was prepped and draped in the usual sterile fashion.       Clinical Data: No additional findings.   Subjective: Chief Complaint  Patient presents with  . Right Knee - Pain    HPI Shelby Day comes in today with right knee pain for about 3 weeks no  known injury.  She states today while she was at the beach she could barely walk due to swelling and pain.  States knee is overall slowly improving.  Pain mostly medial aspect of both knees.  Said no treatment for this.  Review of Systems Denies fevers or chills.  Otherwise see HPI.  Objective: Vital Signs: There were no vitals taken for this visit.  Physical Exam General: Well-developed well-nourished female no acute distress mood affect appropriate Psych: Alert and oriented x3. Ortho Exam Bilateral knees she has full extension full flexion.  Tenderness along medial joint line of both knees right greater than left.  Patellofemoral crepitus left greater than right.  No instability with valgus varus stressing of either knee.  No abnormal warmth erythema of either knee.  Slight effusion of the right knee compared to left. Specialty Comments:  No specialty comments available.  Imaging: Xr Knee 1-2 Views Right  Result Date: 06/22/2018 Right knee AP lateral views: No acute fracture.  Near bone-on-bone medial compartment.  Moderate Tello femoral changes.  Lateral compartment well-preserved.  Knee is well located.    PMFS History: Patient Active Problem List   Diagnosis Date Noted  . Status post total replacement of left hip 07/09/2017  . Unilateral primary osteoarthritis, left hip 05/18/2016   Past Medical History:  Diagnosis Date  . Arthritis    end-stage left hip  .  Diverticulitis 03/23/2007   Noted on CT pelvis: Mild to moderate of prox. sigmoid colon  . Diverticulosis of sigmoid colon 03/23/2007   Noted on CT pelvis  . History of cataract    Bilateral  . History of kidney stones   . Hypertension   . Sleep apnea    USES CPAP    Family History  Problem Relation Age of Onset  . Breast cancer Sister        diagnosed in her early 47's    Past Surgical History:  Procedure Laterality Date  . BLADDER TACH    . CATARACT EXTRACTION W/ INTRAOCULAR LENS  IMPLANT, BILATERAL   01/31/2014 and 02/07/2014  . REFRACTIVE SURGERY Bilateral    20+ years ago LASIX  . TOTAL HIP ARTHROPLASTY Left 07/09/2017   Procedure: LEFT TOTAL HIP ARTHROPLASTY ANTERIOR APPROACH;  Surgeon: Mcarthur Rossetti, MD;  Location: [facility];  Service: Orthopedics;  Laterality: Left;   Social History   Occupational History  . Not on file  Tobacco Use  . Smoking status: Never Smoker  . Smokeless tobacco: Never Used  Substance and Sexual Activity  . Alcohol use: Yes    Comment: RARE  . Drug use: Never  . Sexual activity: Not on file

## 2018-07-15 ENCOUNTER — Other Ambulatory Visit: Payer: Self-pay | Admitting: Family Medicine

## 2018-07-15 DIAGNOSIS — Z1231 Encounter for screening mammogram for malignant neoplasm of breast: Secondary | ICD-10-CM

## 2018-07-16 ENCOUNTER — Ambulatory Visit
Admission: RE | Admit: 2018-07-16 | Discharge: 2018-07-16 | Disposition: A | Discharge status: Home / Self Care | Payer: Medicare Other | Source: Ambulatory Visit | Attending: Family Medicine | Admitting: Family Medicine

## 2018-07-16 ENCOUNTER — Other Ambulatory Visit: Payer: Self-pay

## 2018-07-16 DIAGNOSIS — Z1231 Encounter for screening mammogram for malignant neoplasm of breast: Secondary | ICD-10-CM

## 2019-02-09 ENCOUNTER — Ambulatory Visit: Payer: Medicare Other | Attending: Internal Medicine

## 2019-02-09 DIAGNOSIS — Z23 Encounter for immunization: Secondary | ICD-10-CM | POA: Insufficient documentation

## 2019-02-09 NOTE — Progress Notes (Signed)
   Covid-19 Vaccination Clinic  Name:  Shelby Day    MRN: [id number] DOB: [birth year]  02/09/2019  [name] was observed post Covid-19 immunization for 15 minutes without incidence. She was provided with Vaccine Information Sheet and instruction to access the V-Safe system.   [name] was instructed to call 911 with any severe reactions post vaccine: Marland Kitchen Difficulty breathing  . Swelling of your face and throat  . A fast heartbeat  . A bad rash all over your body  . Dizziness and weakness    Immunizations Administered    Name Date Dose VIS Date Route   Pfizer COVID-19 Vaccine 02/09/2019 10:19 AM 0.3 mL 12/30/2018 Intramuscular   Manufacturer: [place]   Lot: [id number]   [place]: [id number]

## 2019-03-02 ENCOUNTER — Ambulatory Visit: Payer: Medicare Other | Attending: Internal Medicine

## 2019-03-02 DIAGNOSIS — Z23 Encounter for immunization: Secondary | ICD-10-CM | POA: Insufficient documentation

## 2019-03-02 NOTE — Progress Notes (Signed)
   Covid-19 Vaccination Clinic  Name:  Shelby Day    MRN: [id number] DOB: Oct 16, 1945  03/02/2019  [name] was observed post Covid-19 immunization for 15 minutes without incidence. She was provided with Vaccine Information Sheet and instruction to access the V-Safe system.   [name] was instructed to call 911 with any severe reactions post vaccine: Marland Kitchen Difficulty breathing  . Swelling of your face and throat  . A fast heartbeat  . A bad rash all over your body  . Dizziness and weakness    Immunizations Administered    Name Date Dose VIS Date Route   Pfizer COVID-19 Vaccine 03/02/2019 10:35 AM 0.3 mL 12/30/2018 Intramuscular   Manufacturer: [employer], [employer]   Lot: [id number]   [place]: [id number]

## 2019-04-14 IMAGING — DX DG PORTABLE PELVIS
1 series · 1 of 1 positions shown · non-contrast
Comparison: 07/09/2017.

CLINICAL DATA: Status post hip replacement.

EXAM:
PORTABLE PELVIS 1-2 VIEWS

[pelvis ap]
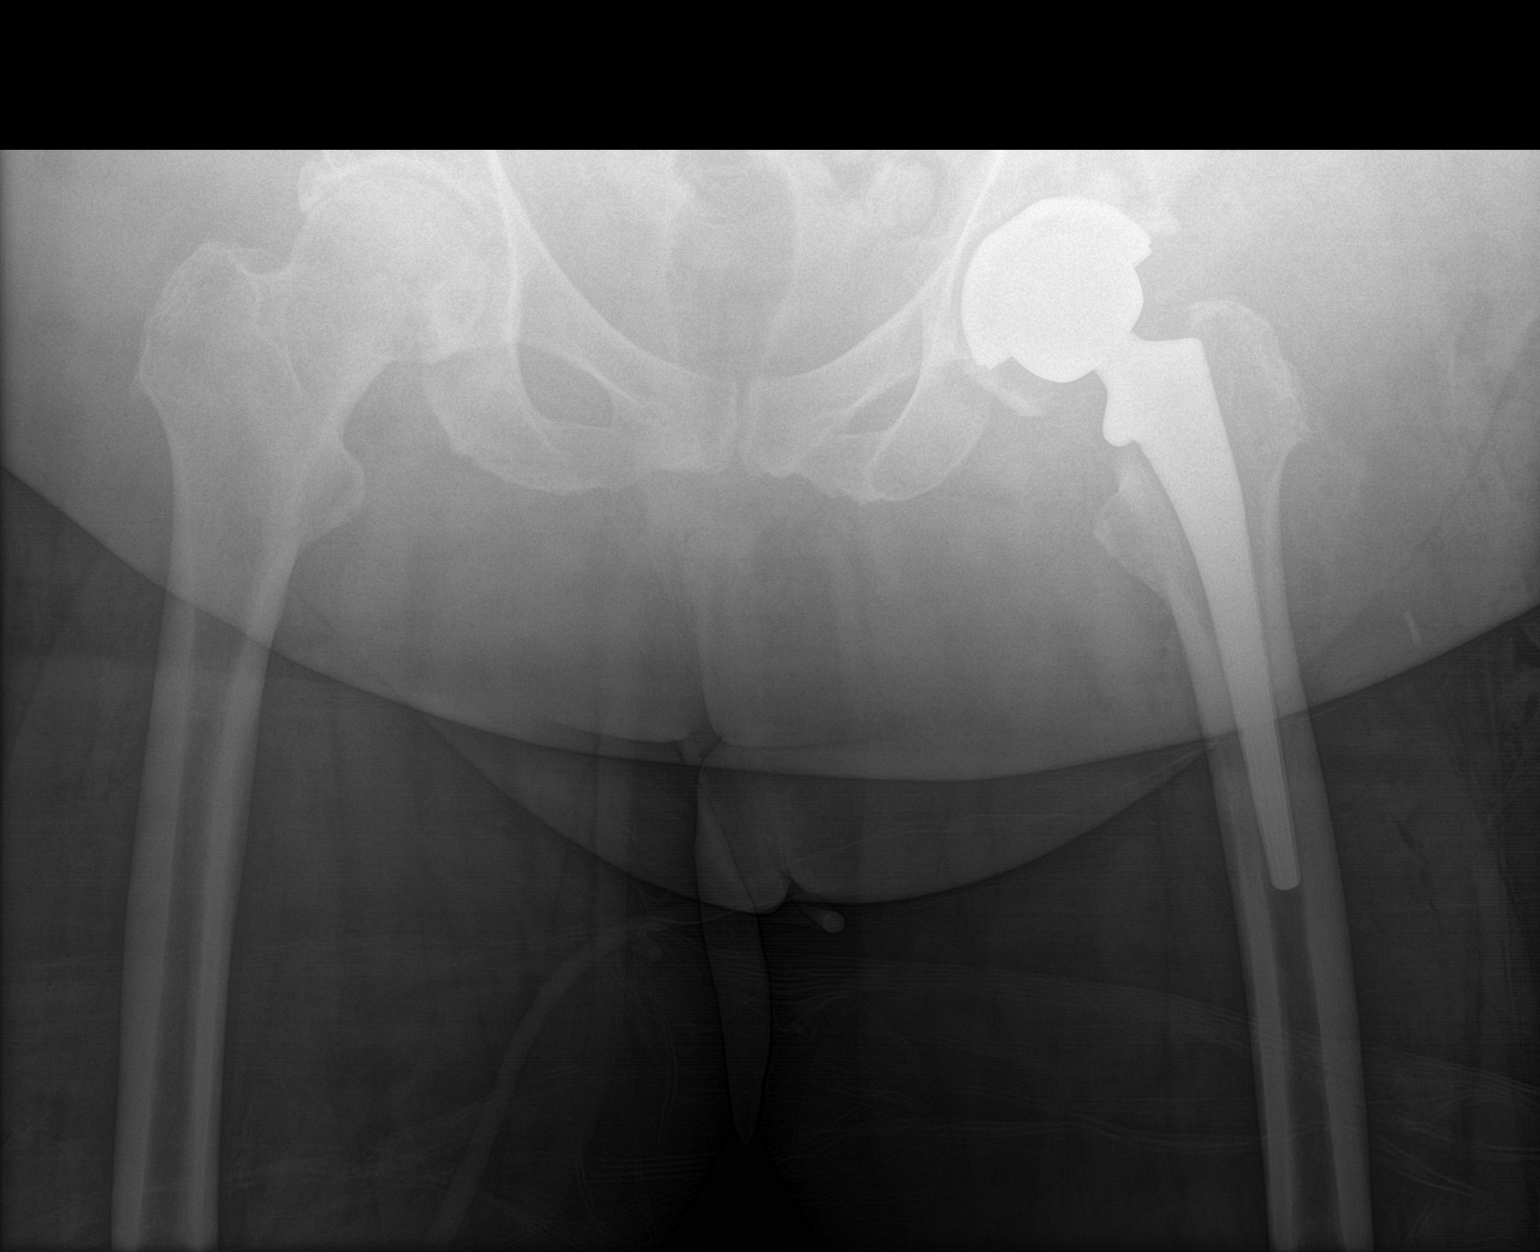

[1 of 1 positions shown; findings below may reference images not displayed]

FINDINGS: Total left hip replacement. Hardware intact. Anatomic alignment. No
acute bony abnormality.
IMPRESSION: Total left hip replacement. Anatomic alignment. No acute bony
abnormality.

## 2019-04-14 IMAGING — RF DG HIP (WITH PELVIS) OPERATIVE*L*
1 series · 2 of 2 positions shown · non-contrast
Comparison: Plain films left hip from [REDACTED]
04/28/2017.

CLINICAL DATA: Intraoperative imaging for left hip replacement.

EXAM:
OPERATIVE LEFT HIP (WITH PELVIS IF PERFORMED) 2 VIEWS
TECHNIQUE: Fluoroscopic spot image(s) were submitted for interpretation
post-operatively.

[Series 1: unknown protocol · 0.20mm/px · 2 of 2 slices shown]
[im 1/2]
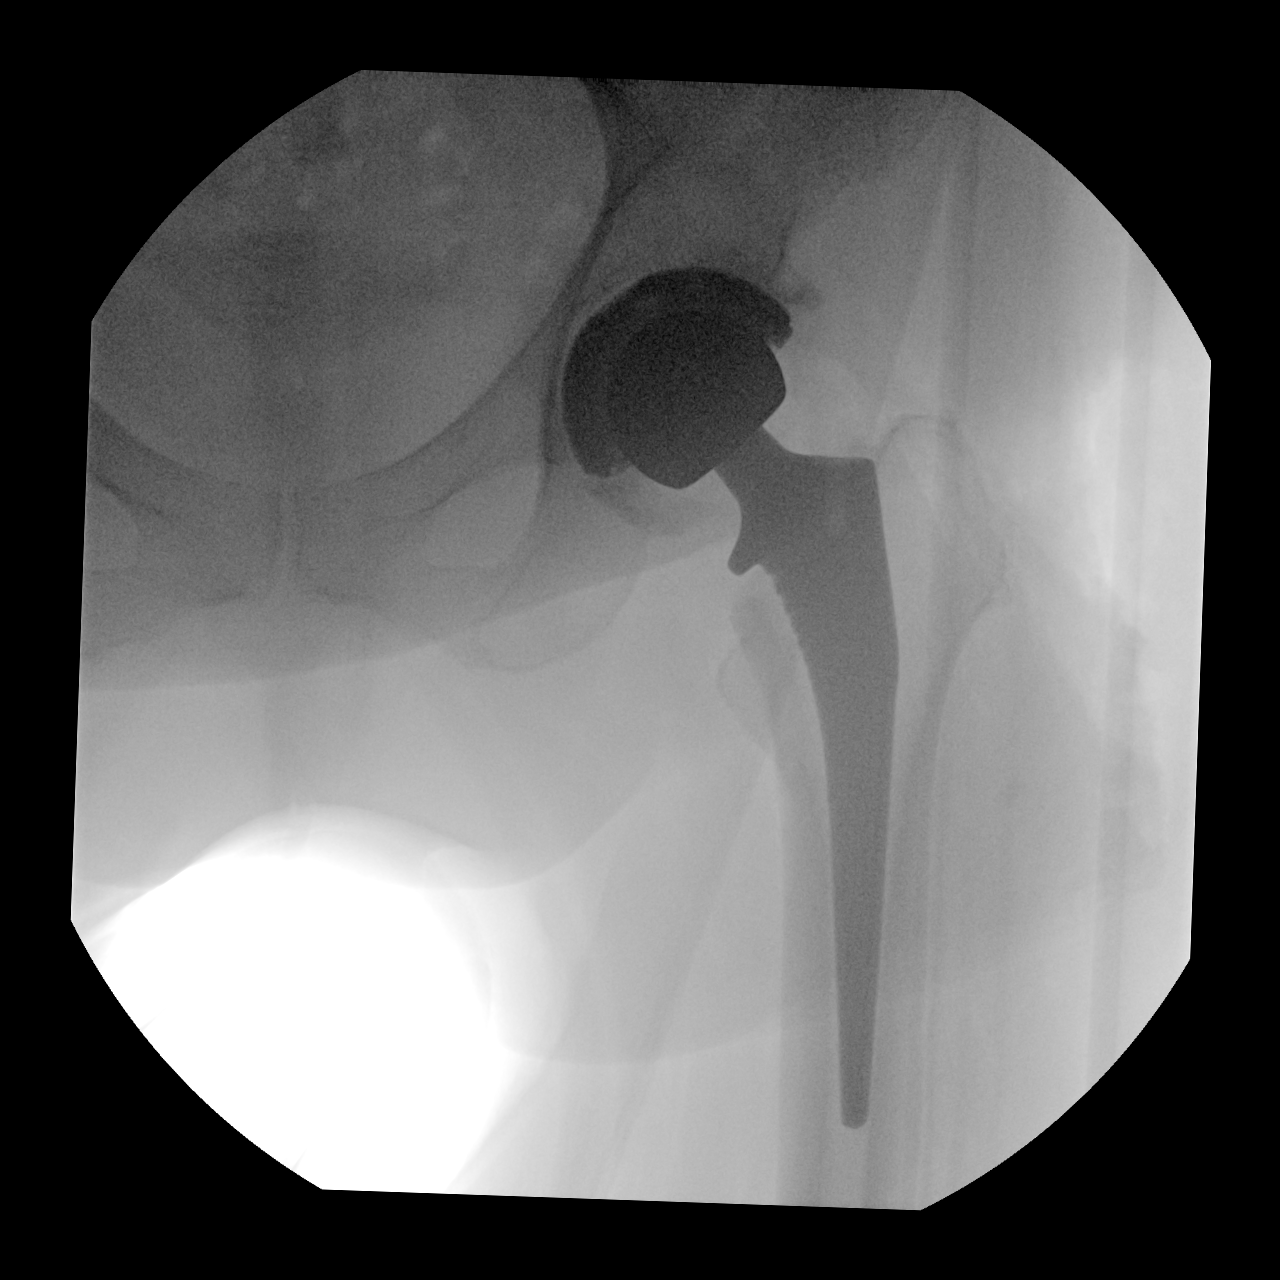
[im 2/2]
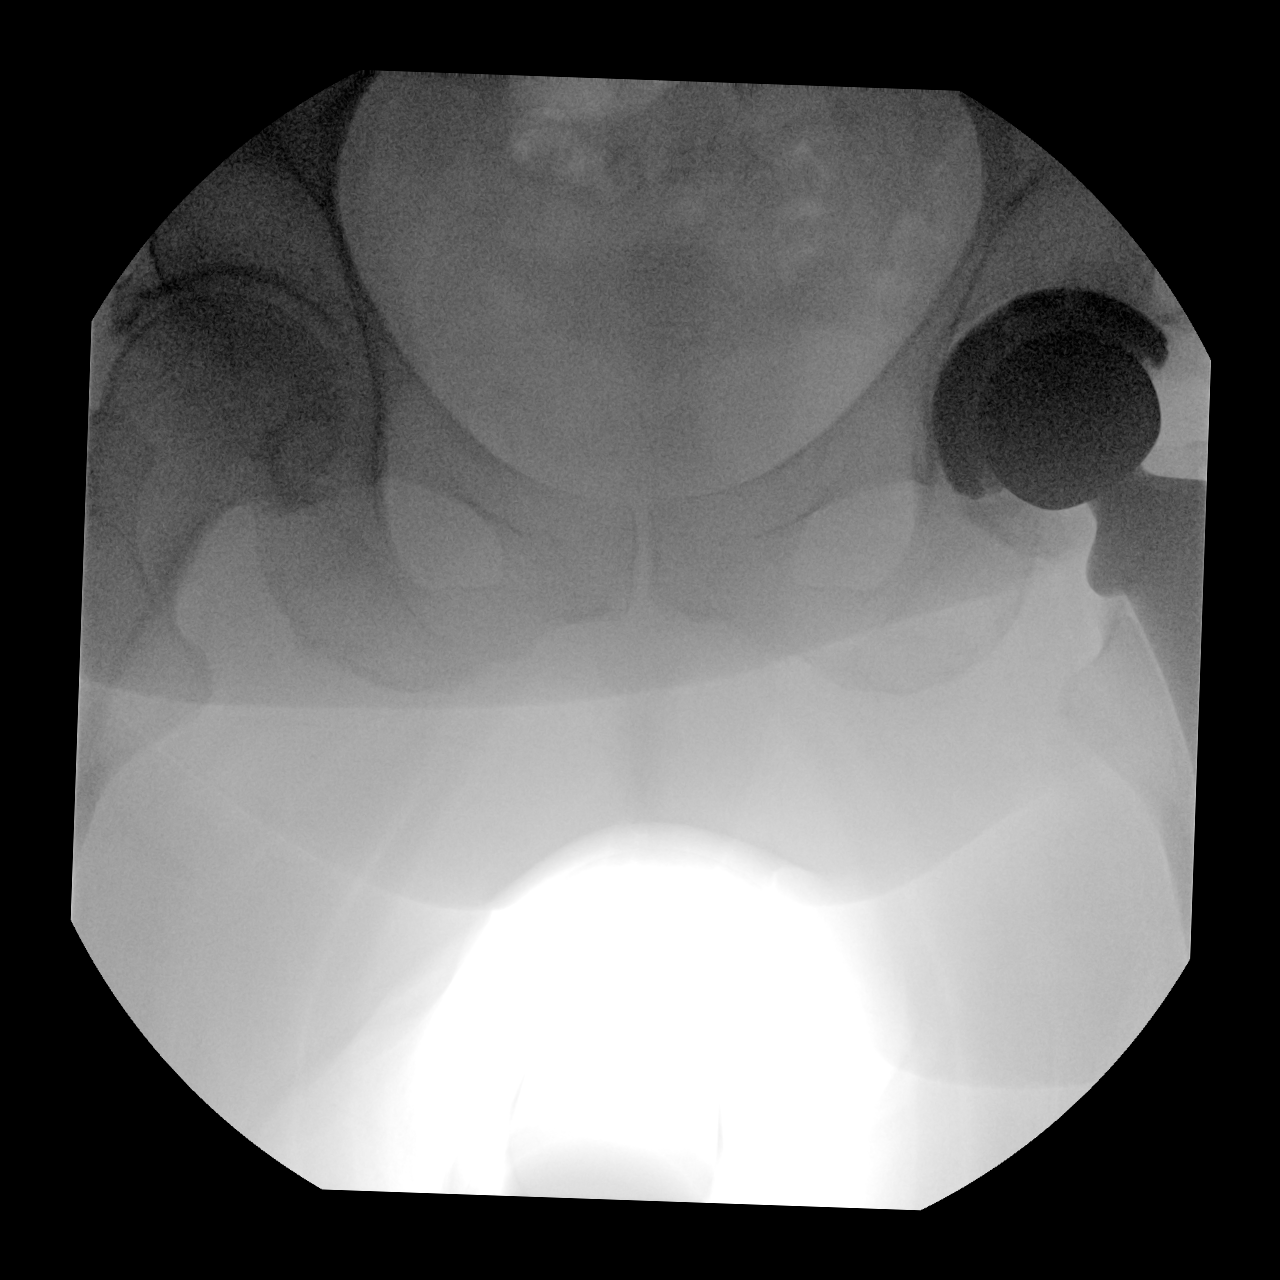

[2 of 2 positions shown; findings below may reference images not displayed]

FINDINGS: Two fluoroscopic spot views of the left hip and low pelvis
demonstrate a left total hip arthroplasty in place. The device is
located. No acute abnormality.
IMPRESSION: Intraoperative imaging for left hip replacement.

## 2019-06-08 ENCOUNTER — Other Ambulatory Visit: Payer: Self-pay | Admitting: Family Medicine

## 2019-06-08 DIAGNOSIS — Z1231 Encounter for screening mammogram for malignant neoplasm of breast: Secondary | ICD-10-CM

## 2019-07-18 ENCOUNTER — Other Ambulatory Visit: Payer: Self-pay

## 2019-07-18 ENCOUNTER — Ambulatory Visit
Admission: RE | Admit: 2019-07-18 | Discharge: 2019-07-18 | Disposition: A | Discharge status: Home / Self Care | Payer: Medicare Other | Source: Ambulatory Visit | Attending: Family Medicine | Admitting: Family Medicine

## 2019-07-18 DIAGNOSIS — Z1231 Encounter for screening mammogram for malignant neoplasm of breast: Secondary | ICD-10-CM

## 2020-01-25 DIAGNOSIS — L720 Epidermal cyst: Secondary | ICD-10-CM | POA: Diagnosis not present

## 2020-01-25 DIAGNOSIS — C4401 Basal cell carcinoma of skin of lip: Secondary | ICD-10-CM | POA: Diagnosis not present

## 2020-01-25 DIAGNOSIS — D485 Neoplasm of uncertain behavior of skin: Secondary | ICD-10-CM | POA: Diagnosis not present

## 2020-01-25 DIAGNOSIS — L570 Actinic keratosis: Secondary | ICD-10-CM | POA: Diagnosis not present

## 2020-01-25 DIAGNOSIS — L821 Other seborrheic keratosis: Secondary | ICD-10-CM | POA: Diagnosis not present

## 2020-01-25 DIAGNOSIS — L718 Other rosacea: Secondary | ICD-10-CM | POA: Diagnosis not present

## 2020-01-31 DIAGNOSIS — Z0000 Encounter for general adult medical examination without abnormal findings: Secondary | ICD-10-CM | POA: Diagnosis not present

## 2020-02-15 ENCOUNTER — Other Ambulatory Visit: Payer: Self-pay | Admitting: Family Medicine

## 2020-02-15 DIAGNOSIS — E2839 Other primary ovarian failure: Secondary | ICD-10-CM

## 2020-02-22 DIAGNOSIS — I10 Essential (primary) hypertension: Secondary | ICD-10-CM | POA: Diagnosis not present

## 2020-02-22 DIAGNOSIS — R609 Edema, unspecified: Secondary | ICD-10-CM | POA: Diagnosis not present

## 2020-02-22 DIAGNOSIS — E7800 Pure hypercholesterolemia, unspecified: Secondary | ICD-10-CM | POA: Diagnosis not present

## 2020-02-22 DIAGNOSIS — R7309 Other abnormal glucose: Secondary | ICD-10-CM | POA: Diagnosis not present

## 2020-02-22 DIAGNOSIS — G4733 Obstructive sleep apnea (adult) (pediatric): Secondary | ICD-10-CM | POA: Diagnosis not present

## 2020-02-22 DIAGNOSIS — Z79899 Other long term (current) drug therapy: Secondary | ICD-10-CM | POA: Diagnosis not present

## 2020-02-29 DIAGNOSIS — L718 Other rosacea: Secondary | ICD-10-CM | POA: Diagnosis not present

## 2020-02-29 DIAGNOSIS — C441192 Basal cell carcinoma of skin of left lower eyelid, including canthus: Secondary | ICD-10-CM | POA: Diagnosis not present

## 2020-03-12 DIAGNOSIS — C441192 Basal cell carcinoma of skin of left lower eyelid, including canthus: Secondary | ICD-10-CM | POA: Diagnosis not present

## 2020-03-13 DIAGNOSIS — C441191 Basal cell carcinoma of skin of left upper eyelid, including canthus: Secondary | ICD-10-CM | POA: Diagnosis not present

## 2020-03-13 DIAGNOSIS — S01412S Laceration without foreign body of left cheek and temporomandibular area, sequela: Secondary | ICD-10-CM | POA: Diagnosis not present

## 2020-03-13 DIAGNOSIS — C441192 Basal cell carcinoma of skin of left lower eyelid, including canthus: Secondary | ICD-10-CM | POA: Diagnosis not present

## 2020-03-13 DIAGNOSIS — Z481 Encounter for planned postprocedural wound closure: Secondary | ICD-10-CM | POA: Diagnosis not present

## 2020-03-13 DIAGNOSIS — S0181XS Laceration without foreign body of other part of head, sequela: Secondary | ICD-10-CM | POA: Diagnosis not present

## 2020-03-13 DIAGNOSIS — Z85828 Personal history of other malignant neoplasm of skin: Secondary | ICD-10-CM | POA: Diagnosis not present

## 2020-03-13 DIAGNOSIS — Z483 Aftercare following surgery for neoplasm: Secondary | ICD-10-CM | POA: Diagnosis not present

## 2020-04-01 DIAGNOSIS — G4733 Obstructive sleep apnea (adult) (pediatric): Secondary | ICD-10-CM | POA: Diagnosis not present

## 2020-06-07 ENCOUNTER — Other Ambulatory Visit: Payer: Self-pay | Admitting: Family Medicine

## 2020-06-07 DIAGNOSIS — Z1231 Encounter for screening mammogram for malignant neoplasm of breast: Secondary | ICD-10-CM

## 2020-06-13 DIAGNOSIS — C441192 Basal cell carcinoma of skin of left lower eyelid, including canthus: Secondary | ICD-10-CM | POA: Diagnosis not present

## 2020-06-13 DIAGNOSIS — D485 Neoplasm of uncertain behavior of skin: Secondary | ICD-10-CM | POA: Diagnosis not present

## 2020-07-01 DIAGNOSIS — G4733 Obstructive sleep apnea (adult) (pediatric): Secondary | ICD-10-CM | POA: Diagnosis not present

## 2020-07-05 DIAGNOSIS — D485 Neoplasm of uncertain behavior of skin: Secondary | ICD-10-CM | POA: Diagnosis not present

## 2020-07-05 DIAGNOSIS — C44519 Basal cell carcinoma of skin of other part of trunk: Secondary | ICD-10-CM | POA: Diagnosis not present

## 2020-07-05 DIAGNOSIS — Z85828 Personal history of other malignant neoplasm of skin: Secondary | ICD-10-CM | POA: Diagnosis not present

## 2020-07-05 DIAGNOSIS — L905 Scar conditions and fibrosis of skin: Secondary | ICD-10-CM | POA: Diagnosis not present

## 2020-07-05 DIAGNOSIS — C44612 Basal cell carcinoma of skin of right upper limb, including shoulder: Secondary | ICD-10-CM | POA: Diagnosis not present

## 2020-07-05 DIAGNOSIS — L820 Inflamed seborrheic keratosis: Secondary | ICD-10-CM | POA: Diagnosis not present

## 2020-07-05 DIAGNOSIS — C44529 Squamous cell carcinoma of skin of other part of trunk: Secondary | ICD-10-CM | POA: Diagnosis not present

## 2020-07-29 DIAGNOSIS — L821 Other seborrheic keratosis: Secondary | ICD-10-CM | POA: Diagnosis not present

## 2020-07-29 DIAGNOSIS — L814 Other melanin hyperpigmentation: Secondary | ICD-10-CM | POA: Diagnosis not present

## 2020-07-29 DIAGNOSIS — L720 Epidermal cyst: Secondary | ICD-10-CM | POA: Diagnosis not present

## 2020-07-29 DIAGNOSIS — C44529 Squamous cell carcinoma of skin of other part of trunk: Secondary | ICD-10-CM | POA: Diagnosis not present

## 2020-07-29 DIAGNOSIS — D045 Carcinoma in situ of skin of trunk: Secondary | ICD-10-CM | POA: Diagnosis not present

## 2020-07-29 DIAGNOSIS — D229 Melanocytic nevi, unspecified: Secondary | ICD-10-CM | POA: Diagnosis not present

## 2020-07-29 DIAGNOSIS — L570 Actinic keratosis: Secondary | ICD-10-CM | POA: Diagnosis not present

## 2020-07-30 DIAGNOSIS — G4733 Obstructive sleep apnea (adult) (pediatric): Secondary | ICD-10-CM | POA: Diagnosis not present

## 2020-07-30 DIAGNOSIS — R7309 Other abnormal glucose: Secondary | ICD-10-CM | POA: Diagnosis not present

## 2020-07-30 DIAGNOSIS — I10 Essential (primary) hypertension: Secondary | ICD-10-CM | POA: Diagnosis not present

## 2020-07-30 DIAGNOSIS — Z79899 Other long term (current) drug therapy: Secondary | ICD-10-CM | POA: Diagnosis not present

## 2020-07-30 DIAGNOSIS — Z23 Encounter for immunization: Secondary | ICD-10-CM | POA: Diagnosis not present

## 2020-07-30 DIAGNOSIS — Z8379 Family history of other diseases of the digestive system: Secondary | ICD-10-CM | POA: Diagnosis not present

## 2020-07-30 DIAGNOSIS — R609 Edema, unspecified: Secondary | ICD-10-CM | POA: Diagnosis not present

## 2020-07-30 DIAGNOSIS — R7301 Impaired fasting glucose: Secondary | ICD-10-CM | POA: Diagnosis not present

## 2020-07-30 DIAGNOSIS — E7800 Pure hypercholesterolemia, unspecified: Secondary | ICD-10-CM | POA: Diagnosis not present

## 2020-07-31 DIAGNOSIS — G4733 Obstructive sleep apnea (adult) (pediatric): Secondary | ICD-10-CM | POA: Diagnosis not present

## 2020-08-02 ENCOUNTER — Ambulatory Visit: Payer: Medicare Other

## 2020-08-13 DIAGNOSIS — C44612 Basal cell carcinoma of skin of right upper limb, including shoulder: Secondary | ICD-10-CM | POA: Diagnosis not present

## 2020-08-28 DIAGNOSIS — C44519 Basal cell carcinoma of skin of other part of trunk: Secondary | ICD-10-CM | POA: Diagnosis not present

## 2020-09-19 ENCOUNTER — Ambulatory Visit
Admission: RE | Admit: 2020-09-19 | Discharge: 2020-09-19 | Disposition: A | Discharge status: Home / Self Care | Payer: Medicare Other | Source: Ambulatory Visit | Attending: Family Medicine | Admitting: Family Medicine

## 2020-09-19 ENCOUNTER — Other Ambulatory Visit: Payer: Self-pay

## 2020-09-19 DIAGNOSIS — Z1231 Encounter for screening mammogram for malignant neoplasm of breast: Secondary | ICD-10-CM

## 2020-09-25 DIAGNOSIS — Z961 Presence of intraocular lens: Secondary | ICD-10-CM | POA: Diagnosis not present

## 2020-09-25 DIAGNOSIS — H5213 Myopia, bilateral: Secondary | ICD-10-CM | POA: Diagnosis not present

## 2020-09-25 DIAGNOSIS — H524 Presbyopia: Secondary | ICD-10-CM | POA: Diagnosis not present

## 2020-10-30 DIAGNOSIS — G4733 Obstructive sleep apnea (adult) (pediatric): Secondary | ICD-10-CM | POA: Diagnosis not present

## 2020-11-11 DIAGNOSIS — G4733 Obstructive sleep apnea (adult) (pediatric): Secondary | ICD-10-CM | POA: Diagnosis not present

## 2020-12-06 DIAGNOSIS — M1612 Unilateral primary osteoarthritis, left hip: Secondary | ICD-10-CM | POA: Diagnosis not present

## 2020-12-06 DIAGNOSIS — M25551 Pain in right hip: Secondary | ICD-10-CM | POA: Diagnosis not present

## 2020-12-06 DIAGNOSIS — Z96642 Presence of left artificial hip joint: Secondary | ICD-10-CM | POA: Diagnosis not present

## 2020-12-06 DIAGNOSIS — G4733 Obstructive sleep apnea (adult) (pediatric): Secondary | ICD-10-CM | POA: Diagnosis not present

## 2021-01-05 DIAGNOSIS — M25551 Pain in right hip: Secondary | ICD-10-CM | POA: Diagnosis not present

## 2021-01-05 DIAGNOSIS — Z96642 Presence of left artificial hip joint: Secondary | ICD-10-CM | POA: Diagnosis not present

## 2021-01-05 DIAGNOSIS — G4733 Obstructive sleep apnea (adult) (pediatric): Secondary | ICD-10-CM | POA: Diagnosis not present

## 2021-01-05 DIAGNOSIS — M1612 Unilateral primary osteoarthritis, left hip: Secondary | ICD-10-CM | POA: Diagnosis not present

## 2021-01-20 ENCOUNTER — Ambulatory Visit: Payer: Self-pay

## 2021-02-10 DIAGNOSIS — F339 Major depressive disorder, recurrent, unspecified: Secondary | ICD-10-CM | POA: Insufficient documentation

## 2021-02-10 DIAGNOSIS — E7800 Pure hypercholesterolemia, unspecified: Secondary | ICD-10-CM | POA: Insufficient documentation

## 2021-02-10 DIAGNOSIS — M1610 Unilateral primary osteoarthritis, unspecified hip: Secondary | ICD-10-CM | POA: Insufficient documentation

## 2021-02-10 DIAGNOSIS — I10 Essential (primary) hypertension: Secondary | ICD-10-CM | POA: Insufficient documentation

## 2021-08-12 ENCOUNTER — Other Ambulatory Visit: Payer: Self-pay | Admitting: Family Medicine

## 2021-08-12 DIAGNOSIS — Z1231 Encounter for screening mammogram for malignant neoplasm of breast: Secondary | ICD-10-CM

## 2021-09-29 ENCOUNTER — Ambulatory Visit
Admission: RE | Admit: 2021-09-29 | Discharge: 2021-09-29 | Disposition: A | Discharge status: Home / Self Care | Payer: Medicare Other | Source: Ambulatory Visit | Attending: Family Medicine | Admitting: Family Medicine

## 2021-09-29 DIAGNOSIS — Z1231 Encounter for screening mammogram for malignant neoplasm of breast: Secondary | ICD-10-CM

## 2021-10-20 ENCOUNTER — Ambulatory Visit (INDEPENDENT_AMBULATORY_CARE_PROVIDER_SITE_OTHER): Payer: Medicare Other

## 2021-10-20 ENCOUNTER — Ambulatory Visit: Payer: Medicare Other | Admitting: Orthopaedic Surgery

## 2021-10-20 DIAGNOSIS — M25562 Pain in left knee: Secondary | ICD-10-CM

## 2021-10-20 MED ORDER — LIDOCAINE HCL 1 % IJ SOLN
3.0000 mL | INTRAMUSCULAR | Status: AC | PRN
  Administered 2021-10-20: 3 mL

## 2021-10-20 MED ORDER — METHYLPREDNISOLONE ACETATE 40 MG/ML IJ SUSP
40.0000 mg | INTRAMUSCULAR | Status: AC | PRN
  Administered 2021-10-20: 40 mg via INTRA_ARTICULAR

## 2021-10-20 NOTE — Progress Notes (Signed)
Office Visit Note   Patient: Shelby Day           Date of Birth: May 02, 1945           MRN: [id number] Visit Date: 10/20/2021              Requested by: No referring provider defined for this encounter. PCP: Leighton Ruff, MD (Inactive)   Assessment & Plan: Visit Diagnoses:  1. Acute pain of left knee     Plan: I did recommend a steroid injection in her left knee today to see how this treats her acute pain.  Of also recommended Voltaren gel at least twice to 3 times daily topically over-the-counter on the medial aspect of her left knee.  She did tolerate steroid injection well in her left knee.  I would like to see her back in just 2 weeks because if she still having significant pain I would send her for an MRI of the left knee.  All questions and concerns were answered and addressed.  Follow-Up Instructions: Return in about 2 weeks (around 11/03/2021).   Orders:  Orders Placed This Encounter  Procedures   Large Joint Inj   XR Knee 1-2 Views Left   No orders of the defined types were placed in this encounter.     Procedures: Large Joint Inj: L knee on 10/20/2021 3:45 PM Indications: diagnostic evaluation and pain Details: 22 G 1.5 in needle, superolateral approach  Arthrogram: No  Medications: 3 mL lidocaine 1 %; 40 mg methylPREDNISolone acetate 40 MG/ML Outcome: tolerated well, no immediate complications Procedure, treatment alternatives, risks and benefits explained, specific risks discussed. Consent was given by the patient. Immediately prior to procedure a time out was called to verify the correct patient, procedure, equipment, support staff and site/side marked as required. Patient was prepped and draped in the usual sterile fashion.       Clinical Data: No additional findings.   Subjective: Chief Complaint  Patient presents with   Left Knee - Pain  The patient is a 76 year old that we have seen before for her right knee.  She comes in with left knee  pain has been really significantly painful over the last few days with no known injury.  She points to the medial joint line as a source of her pain.  She is never had surgery on that knee and not had any type of injection.  It hurts more with weightbearing and has become very painful to her.  HPI  Review of Systems There is currently listed no fever, chills, nausea, vomiting  Objective: Vital Signs: There were no vitals taken for this visit.  Physical Exam She is alert and orient x3 and in no acute distress Ortho Exam Examination of her left knee shows significant medial joint line tenderness.  Her extensor mechanism is intact.  She does her past 90 degrees of flexion on the medial and posterior medial aspect of the left knee.  It feels ligamentously stable. Specialty Comments:  No specialty comments available.  Imaging: XR Knee 1-2 Views Left  Result Date: 10/20/2021 2 views of the left knee show no acute findings.  There is patellofemoral arthritic changes and medial joint space narrowing with slight varus malalignment.    PMFS History: Patient Active Problem List   Diagnosis Date Noted   Status post total replacement of left hip 07/09/2017   Unilateral primary osteoarthritis, left hip 05/18/2016   Past Medical History:  Diagnosis Date   Arthritis  end-stage left hip   Diverticulitis 03/23/2007   Noted on CT pelvis: Mild to moderate of prox. sigmoid colon   Diverticulosis of sigmoid colon 03/23/2007   Noted on CT pelvis   History of cataract    Bilateral   History of kidney stones    Hypertension    Sleep apnea    USES CPAP    Family History  Problem Relation Age of Onset   Breast cancer Sister        diagnosed in her early 15's    Past Surgical History:  Procedure Laterality Date   BLADDER TACH     BREAST CYST ASPIRATION     CATARACT EXTRACTION W/ INTRAOCULAR LENS  IMPLANT, BILATERAL  01/31/2014 and 02/07/2014   REFRACTIVE SURGERY Bilateral    20+ years ago  [place] Left 07/09/2017   Procedure: LEFT TOTAL HIP ARTHROPLASTY ANTERIOR APPROACH;  Surgeon: Mcarthur Rossetti, MD;  Location: [facility];  Service: Orthopedics;  Laterality: Left;   Social History   Occupational History   Not on file  Tobacco Use   Smoking status: Never   Smokeless tobacco: Never  Vaping Use   Vaping Use: Never used  Substance and Sexual Activity   Alcohol use: Yes    Comment: RARE   Drug use: Never   Sexual activity: Not on file

## 2021-11-10 ENCOUNTER — Ambulatory Visit: Payer: Medicare Other | Admitting: Orthopaedic Surgery

## 2022-02-11 ENCOUNTER — Other Ambulatory Visit: Payer: Self-pay | Admitting: Family Medicine

## 2022-02-11 DIAGNOSIS — E2839 Other primary ovarian failure: Secondary | ICD-10-CM

## 2022-03-12 ENCOUNTER — Other Ambulatory Visit: Payer: Self-pay | Admitting: Family Medicine

## 2022-03-12 DIAGNOSIS — Z1231 Encounter for screening mammogram for malignant neoplasm of breast: Secondary | ICD-10-CM

## 2022-04-23 ENCOUNTER — Ambulatory Visit
Admission: RE | Admit: 2022-04-23 | Discharge: 2022-04-23 | Disposition: A | Discharge status: Home / Self Care | Payer: Medicare Other | Source: Ambulatory Visit | Attending: Family Medicine | Admitting: Family Medicine

## 2022-04-23 DIAGNOSIS — Z1231 Encounter for screening mammogram for malignant neoplasm of breast: Secondary | ICD-10-CM

## 2022-06-25 IMAGING — MG MM DIGITAL SCREENING BILAT W/ TOMO AND CAD
6 of 10 series · 6 of 30 positions shown · non-contrast
Comparison: Previous exam(s).

CLINICAL DATA: Screening.

EXAM:
DIGITAL SCREENING BILATERAL MAMMOGRAM WITH TOMOSYNTHESIS AND CAD
TECHNIQUE: Bilateral screening digital craniocaudal and mediolateral oblique
mammograms were obtained. Bilateral screening digital breast
tomosynthesis was performed. The images were evaluated with
computer-aided detection.

[L CC synth-2D]
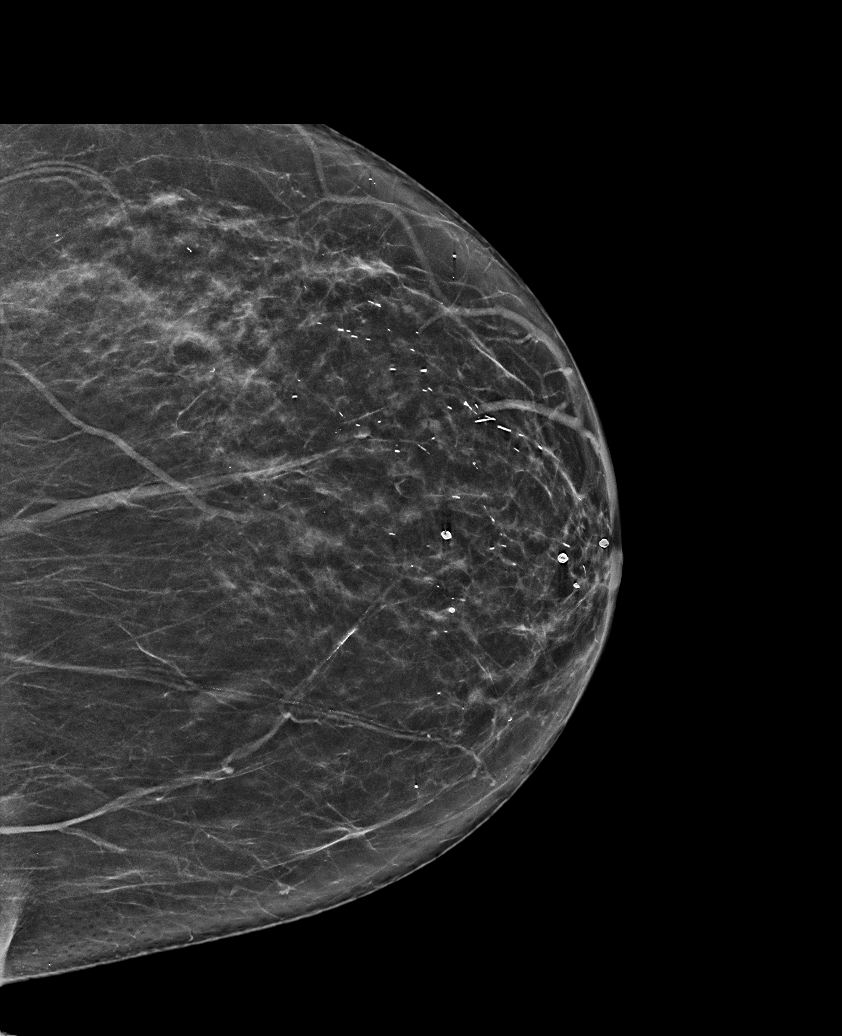

[L MLO synth-2D]
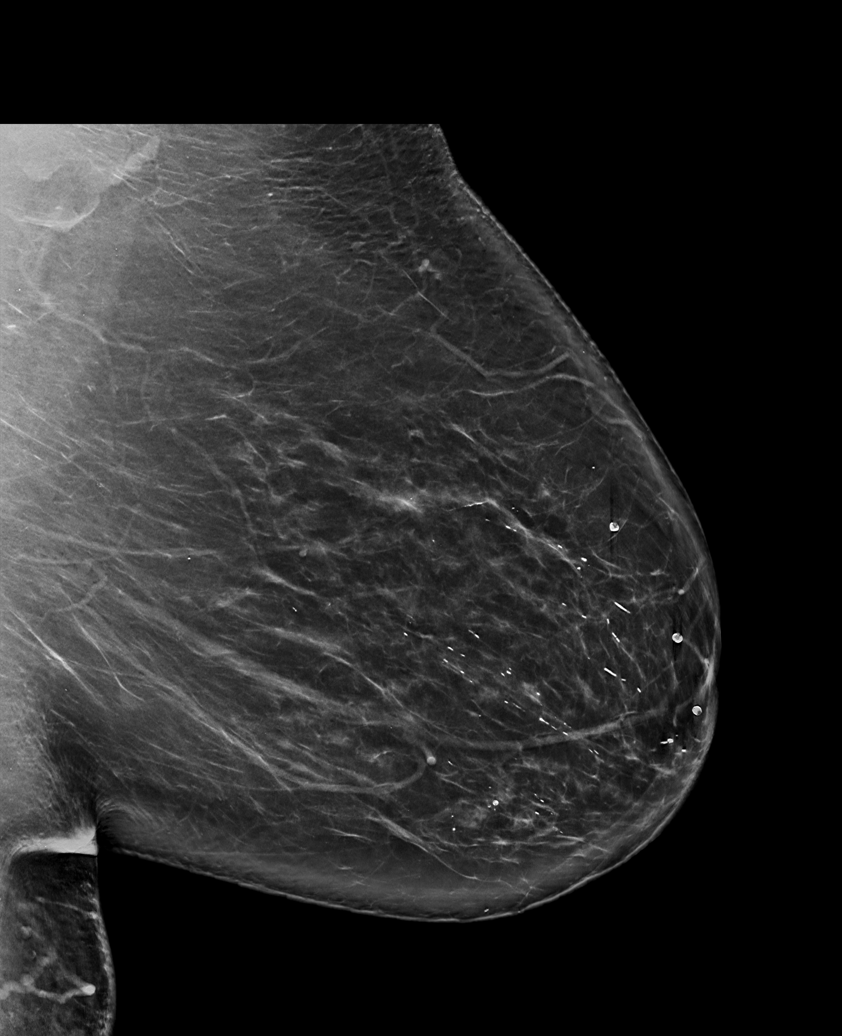

[R CC synth-2D]
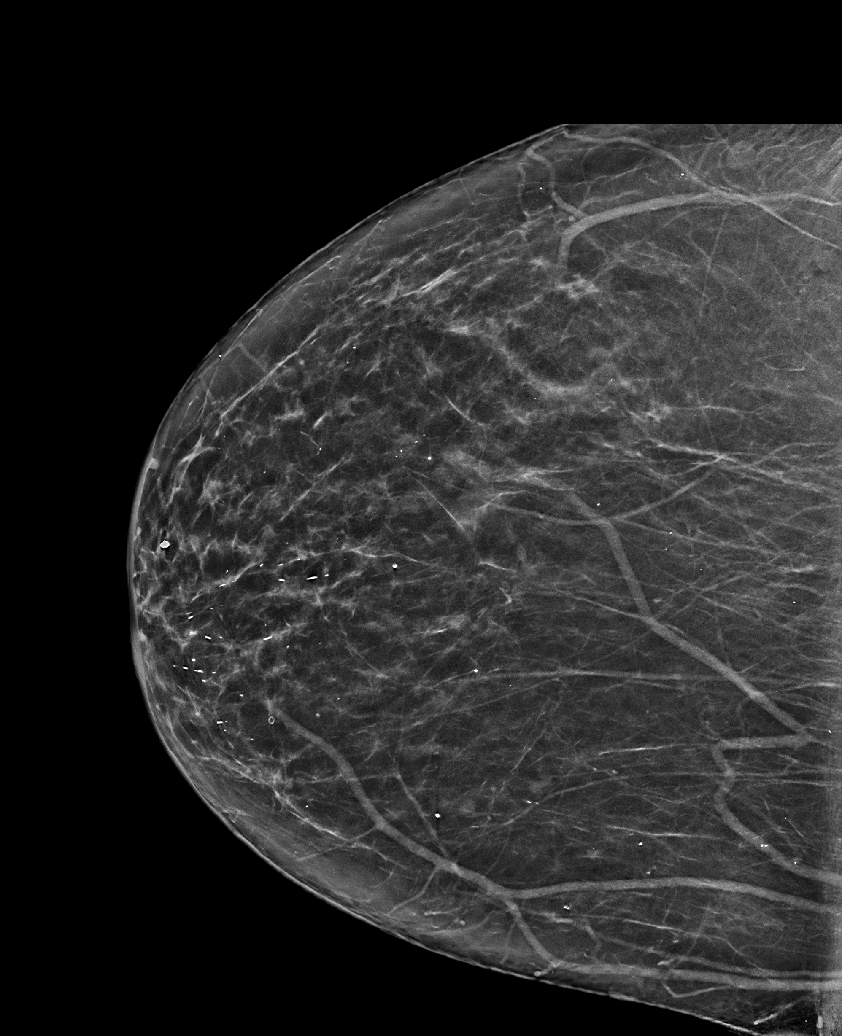

[R MLO synth-2D]
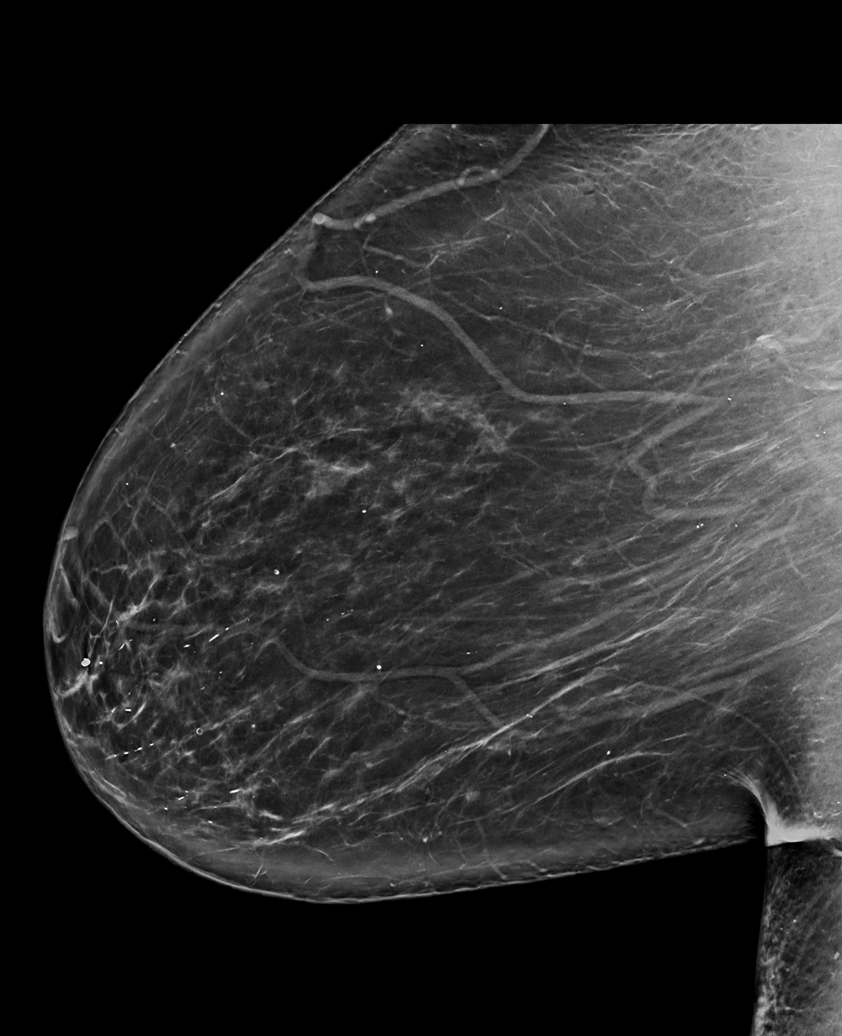

[R CV synth-2D]
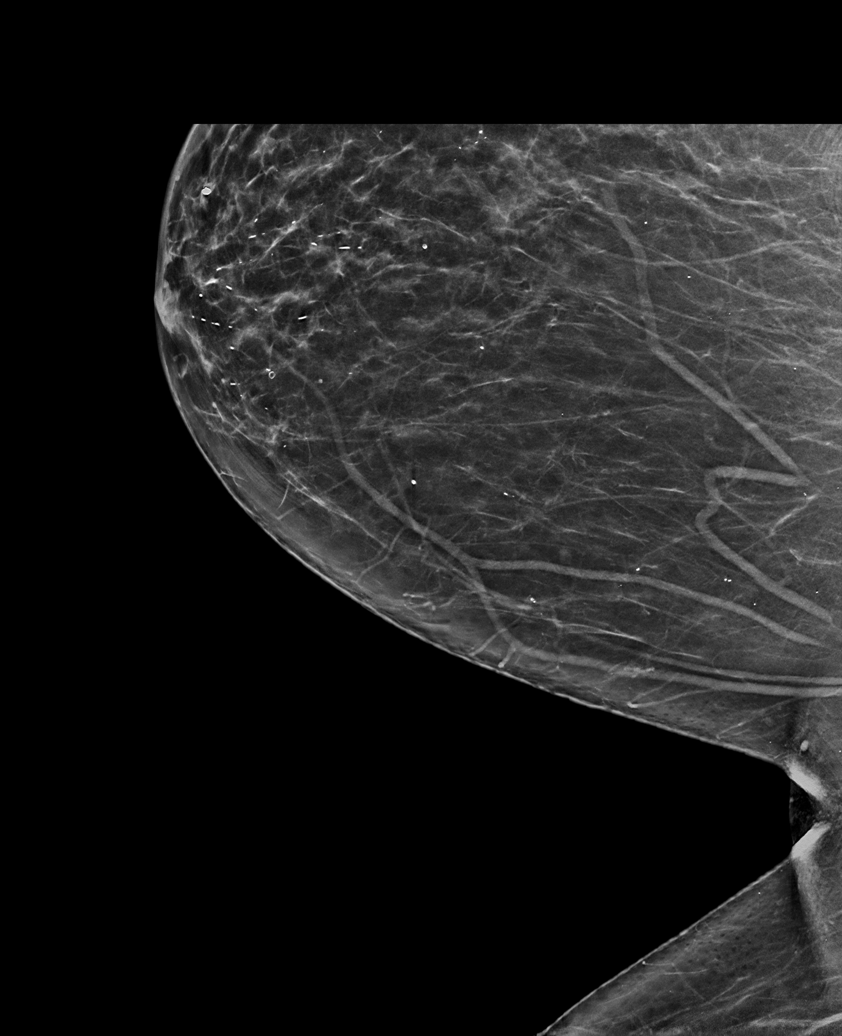

[L CC tomo · tomo slice 33/66.0]
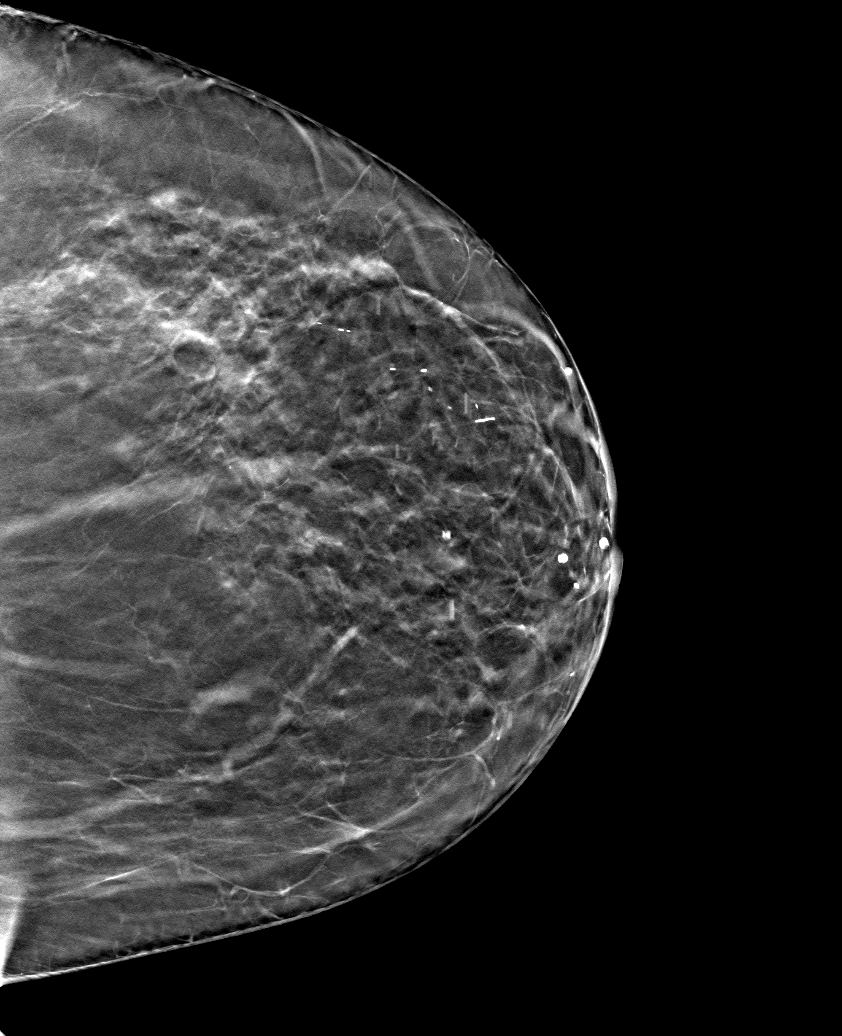

[6 of 30 positions shown; findings below may reference images not displayed]

ACR Breast Density Category b: There are scattered areas of
fibroglandular density.
FINDINGS: There are no findings suspicious for malignancy.
IMPRESSION: No mammographic evidence of malignancy. A result letter of this
screening mammogram will be mailed directly to the patient.

RECOMMENDATION:
Screening mammogram in one year. (Code:51-O-LD2)

BI-RADS CATEGORY  1: Negative.

## 2022-08-07 ENCOUNTER — Other Ambulatory Visit: Payer: Medicare Other

## 2022-11-12 ENCOUNTER — Ambulatory Visit
Admission: RE | Admit: 2022-11-12 | Discharge: 2022-11-12 | Disposition: A | Discharge status: Home / Self Care | Payer: Medicare Other | Source: Ambulatory Visit | Attending: Family Medicine | Admitting: Family Medicine

## 2022-11-12 DIAGNOSIS — E2839 Other primary ovarian failure: Secondary | ICD-10-CM

## 2023-06-17 ENCOUNTER — Other Ambulatory Visit (HOSPITAL_BASED_OUTPATIENT_CLINIC_OR_DEPARTMENT_OTHER): Payer: Self-pay | Admitting: Family Medicine

## 2023-06-17 DIAGNOSIS — E782 Mixed hyperlipidemia: Secondary | ICD-10-CM

## 2023-07-14 ENCOUNTER — Ambulatory Visit (HOSPITAL_BASED_OUTPATIENT_CLINIC_OR_DEPARTMENT_OTHER)
Admission: RE | Admit: 2023-07-14 | Discharge: 2023-07-14 | Disposition: A | Discharge status: Home / Self Care | Payer: Self-pay | Source: Ambulatory Visit | Attending: Family Medicine | Admitting: Family Medicine

## 2023-07-14 DIAGNOSIS — E782 Mixed hyperlipidemia: Secondary | ICD-10-CM | POA: Insufficient documentation

## 2023-08-01 ENCOUNTER — Ambulatory Visit: Admission: EM | Admit: 2023-08-01 | Discharge: 2023-08-01 | Disposition: A | Discharge status: Home / Self Care

## 2023-08-01 ENCOUNTER — Encounter (HOSPITAL_BASED_OUTPATIENT_CLINIC_OR_DEPARTMENT_OTHER): Payer: Self-pay

## 2023-08-01 ENCOUNTER — Other Ambulatory Visit: Payer: Self-pay

## 2023-08-01 ENCOUNTER — Emergency Department (HOSPITAL_BASED_OUTPATIENT_CLINIC_OR_DEPARTMENT_OTHER)

## 2023-08-01 ENCOUNTER — Emergency Department (HOSPITAL_BASED_OUTPATIENT_CLINIC_OR_DEPARTMENT_OTHER)
Admission: EM | Admit: 2023-08-01 | Discharge: 2023-08-01 | Disposition: A | Discharge status: Home / Self Care | Source: Ambulatory Visit | Attending: Emergency Medicine | Admitting: Emergency Medicine

## 2023-08-01 ENCOUNTER — Emergency Department (HOSPITAL_BASED_OUTPATIENT_CLINIC_OR_DEPARTMENT_OTHER): Admitting: Radiology

## 2023-08-01 DIAGNOSIS — W19XXXA Unspecified fall, initial encounter: Secondary | ICD-10-CM

## 2023-08-01 DIAGNOSIS — S0990XA Unspecified injury of head, initial encounter: Secondary | ICD-10-CM

## 2023-08-01 DIAGNOSIS — M25561 Pain in right knee: Secondary | ICD-10-CM

## 2023-08-01 DIAGNOSIS — Y9222 Religious institution as the place of occurrence of the external cause: Secondary | ICD-10-CM | POA: Diagnosis not present

## 2023-08-01 DIAGNOSIS — W01198A Fall on same level from slipping, tripping and stumbling with subsequent striking against other object, initial encounter: Secondary | ICD-10-CM | POA: Diagnosis not present

## 2023-08-01 DIAGNOSIS — M542 Cervicalgia: Secondary | ICD-10-CM | POA: Diagnosis not present

## 2023-08-01 DIAGNOSIS — M25571 Pain in right ankle and joints of right foot: Secondary | ICD-10-CM | POA: Insufficient documentation

## 2023-08-01 DIAGNOSIS — S0083XA Contusion of other part of head, initial encounter: Secondary | ICD-10-CM | POA: Diagnosis not present

## 2023-08-01 DIAGNOSIS — S0093XA Contusion of unspecified part of head, initial encounter: Secondary | ICD-10-CM

## 2023-08-01 DIAGNOSIS — Z7982 Long term (current) use of aspirin: Secondary | ICD-10-CM | POA: Insufficient documentation

## 2023-08-01 NOTE — ED Provider Notes (Signed)
 [facility]    CSN: [id number] Arrival date & time: 08/01/23  1003      History   Chief Complaint Chief Complaint  Patient presents with   Fall    HPI [facility] Day is a 78 y.o. female.   HPI She is here with her husband who convinced her to come  Pt presents today for injuries from a fall that occurred just PTA. She reports falling and slamming her head against a camera platform at [employer] at approx 8 AM today She denies LOC, nausea or vomiting, confusion. Her husband reports that he has not noticed confusion, trouble concentrating, speech difficulty or trouble ambulating since the incident  She reports that she is having a headache along with right knee and ankle pain     She is not on blood thinners   Past Medical History:  Diagnosis Date   Arthritis    end-stage left hip   Diverticulitis 03/23/2007   Noted on CT pelvis: Mild to moderate of prox. 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      Home Medications    Prior to Admission medications   Medication Sig Start Date End Date Taking? Authorizing Provider  amoxicillin  (AMOXIL ) 500 MG tablet //take  2 by mouth one hour before dentist cleaning, then 2 by mouth six hours after appointment 08/19/17   Vernetta Lonni GRADE, MD  Ascorbic Acid  (VITAMIN C ) 1000 MG tablet Take 1,000 mg by mouth every morning.    [provider]  aspirin  81 MG chewable tablet Chew 1 tablet (81 mg total) by mouth 2 (two) times daily. 07/11/17   Vernetta Lonni GRADE, MD  calcium  carbonate (OSCAL) 1500 (600 Ca) MG TABS tablet Take 600 mg of elemental calcium  by mouth daily with breakfast.    [provider]  Cholecalciferol  (VITAMIN D3) 5000 units TABS Take 5,000 Units by mouth every morning.    [provider]  citalopram  (CELEXA ) 10 MG tablet Take 10 mg by mouth every morning.  04/09/17   [provider]  Glucosamine-MSM-Hyaluronic Acd (JOINT HEALTH PO) Take 1 tablet by mouth every morning.    [provider]  hydrochlorothiazide  (HYDRODIURIL ) 25 MG tablet Take 12.5 mg by mouth every morning.  04/06/17   [provider]  HYDROcodone -acetaminophen  (NORCO/VICODIN) 5-325 MG tablet Take 1-2 tablets by mouth every 4 (four) hours as needed for moderate pain (pain score 4-6). 07/11/17   Vernetta Lonni GRADE, MD  Krill Oil 500 MG CAPS Take 500 mg by mouth every morning.    [provider]  MELATONIN PO Take 0.5 tablets by mouth at bedtime as needed (sleep).    [provider]  methocarbamol  (ROBAXIN ) 500 MG tablet Take 1 tablet (500 mg total) by mouth every 6 (six) hours as needed for muscle spasms. 07/11/17   Vernetta Lonni GRADE, MD  Multiple Vitamin (MULTIVITAMIN WITH MINERALS) TABS tablet Take 2 tablets by mouth every morning.    [provider]    Family History Family History  Problem Relation Age of Onset   Breast cancer Sister        diagnosed in her early 59's    Social History Social History   Tobacco Use   Smoking status: Never   Smokeless tobacco: Never  Vaping Use   Vaping status: Never Used  Substance Use Topics    Alcohol use: Yes    Comment: RARE   Drug use: Never     Allergies   Codeine   Review of Systems Review of Systems  Eyes:  Negative for photophobia and visual disturbance.  Gastrointestinal:  Negative for nausea and vomiting.  Musculoskeletal:  Positive for arthralgias. Negative for myalgias.  Skin:  Positive for wound.  Neurological:  Positive for headaches. Negative for dizziness, tremors, syncope, facial asymmetry, speech difficulty, light-headedness and numbness.  Psychiatric/Behavioral:  Negative for confusion and decreased concentration.      Physical Exam Triage Vital Signs ED Triage Vitals  Encounter Vitals Group     BP 08/01/23 1015 (!) 146/83     Girls Systolic BP Percentile --      Girls Diastolic BP Percentile --      Boys Systolic BP Percentile --      Boys Diastolic BP Percentile --      Pulse Rate 08/01/23 1015 78     Resp 08/01/23 1015 20     Temp 08/01/23 1015 97.8 F (36.6 C)     Temp Source 08/01/23 1015 Oral     SpO2 08/01/23 1015 95 %     Weight 08/01/23 1015 270 lb (122.5 kg)     Height 08/01/23 1015 5' 3 (1.6 m)     Head Circumference --      Peak Flow --      Pain Score 08/01/23 1021 1     Pain Loc --      Pain Education --      Exclude from Growth Chart --    No data found.  Updated Vital Signs BP (!) 146/83 (BP Location: Right Arm)   Pulse 78   Temp 97.8 F (36.6 C) (Oral)   Resp 20   Ht 5' 3 (1.6 m)   Wt 270 lb (122.5 kg)   SpO2 95%   BMI 47.83 kg/m   Visual Acuity Right Eye Distance:   Left Eye Distance:   Bilateral Distance:    Right Eye Near:   Left Eye Near:    Bilateral Near:     Physical Exam Vitals reviewed.  Constitutional:      General: She is awake. She is not in acute distress.    Appearance: Normal appearance. She is well-developed and well-groomed. She is not ill-appearing or toxic-appearing.  HENT:     Head: Normocephalic. Abrasion and contusion present. No raccoon eyes or Battle's sign.   Eyes:      General: Lids are normal. Gaze aligned appropriately.     Extraocular Movements: Extraocular movements intact.     Conjunctiva/sclera: Conjunctivae normal.     Pupils: Pupils are equal, round, and reactive to light.  Neck:      Comments: Pt has decreased ROM with regards to lateral flexion- unsure if this is new vs chronic  Musculoskeletal:     Cervical back: Pain with movement and muscular tenderness present. No spinous process tenderness. Decreased range of motion.     Right knee: No swelling, deformity, effusion, erythema or ecchymosis. Normal range of motion. Tenderness present over the medial joint line. No LCL laxity, MCL laxity, ACL laxity or PCL laxity.     Instability Tests: Anterior drawer test negative. Posterior drawer test negative. Medial McMurray test negative and lateral McMurray test negative.     Right ankle: Swelling present. No deformity or ecchymosis. Tenderness present. Normal range of motion. Anterior drawer test negative.       Legs:     Comments: Pt has intact ROM of the right knee and ankle. No obvious signs of swelling,bruising or erythema in either joint   Neurological:     General: No focal deficit present.     Mental Status: She is alert and oriented to person, place, and time.     GCS: GCS eye subscore is 4. GCS verbal subscore is 5. GCS motor subscore is 6.     Cranial Nerves: No cranial nerve deficit, dysarthria or facial asymmetry.     Motor: No weakness, tremor, atrophy or abnormal muscle tone.     Coordination: Finger-Nose-Finger Test normal.     Gait: Gait abnormal.     Comments: Pt has antalgic gait due to ankle pain  Comments: MENTAL STATUS: AAOx3, memory intact, fund of knowledge appropriate   LANG/SPEECH: Naming and repetition intact, fluent, no dysarthria, follows 3-step commands, answers questions appropriately     CRANIAL NERVES:   II: Pupils equal and reactive, no RAPD   III, IV, VI: EOM intact, no gaze preference or deviation, no  nystagmus.   V: normal sensation in V1, V2, and V3 segments bilaterally   VII: no asymmetry, no nasolabial fold flattening   VIII: normal hearing to speech   IX, X: normal palatal elevation, no uvular deviation   XI: 5/5 head turn and 5/5 shoulder shrug bilaterally   XII: midline tongue protrusion   MOTOR:  5/5 bilateral grip strength 5/5 strength dorsiflexion/plantarflexion b/l   COORD: Normal finger to nose, no tremor, no dysmetria   STATION: normal stance, no truncal ataxia   GAIT: antalgic; patient able to tip-toe, heel-walk. She had some difficulty with tip-toe and heel-walk on the right side due to pain but she is able to complete these with the left side     Psychiatric:        Behavior: Behavior is cooperative.      UC Treatments / Results  Labs (all labs ordered are listed, but only abnormal results are displayed) Labs Reviewed - No data to display  EKG   Radiology No results found.  Procedures Procedures (including critical care time)  Medications Ordered in UC Medications - No data to display  Initial Impression / Assessment and Plan / UC Course  I have reviewed the triage vital signs and the nursing notes.  Pertinent labs & imaging results that were available during my care of the patient were reviewed by me and considered in my medical decision making (see chart for details).    MDCalc scores utilized today:  NEXUS head CT instrument: high risk of significant intracranial injuries indicating CT is necessary    Final Clinical Impressions(s) / UC Diagnoses   Final diagnoses:  Fall, initial encounter  Traumatic injury of head, initial encounter  Acute pain of right knee  Acute right ankle pain   Patient presents today with concerns for injury sustained from a fall that occurred at approximately 8 AM today.  She reports lingering headache, right knee pain and right ankle pain.  Patient did strike her head rather forcefully during the fall but she  denies LOC, nausea, vomiting, confusion or other neurological deficits.  Her husband confirms the same.  Neurological exam is largely reassuring with the exception of gait changes which I suspect are secondary to knee and ankle pain.  Right knee does not show evidence of swelling, erythema, bruising, laxity or decreased range of motion.  Right ankle does not show evidence of acute swelling, bruising, laxity, decreased range of motion with active and passive exam.  At this time I suspect likely sprain/strain of the ankle and knee and do not suspect x-ray will add to overall management plan.  Recommend conservative measures for joint injuries.  Given mechanism of injury, patient's age as well as injury sustained during the fall I am concerned for potential intracranial injury.  I reviewed my concerns as well as neurological exam findings with patient and her husband.  I reviewed that I recommend evaluation with a head CT to ensure that there is no intracranial bleed which may cause issues over the next 24 to 48 hours.  She is amenable to being seen at a med center ED for evaluation.  Declines transportation via EMS stating that they will go private vehicle.    Discharge Instructions      You were seen today for concerns of injuries sustained after a fall earlier this AM. Your neurological exam is reassuring but given the mechanism of your fall and injuries I strongly recommend going to the ED for a head CT scan to ensure there isn't an intracranial injury or bleed that may cause further issues.You have decided to go to a nearby Med Center by private vehicle for evaluation. If you start having more severe symptoms or cannot complete the journey to the ED please call 911 for further assistance.  Your right knee and ankle do not show obvious signs of fracture or injury at this time and should respond well to home measures as outlined below:  Rest Warm compresses to the area (20 minutes on, minimum of 30 minutes  off) You can alternate Tylenol  and Ibuprofen for pain management but Ibuprofen is typically preferred to reduce inflammation.   Gentle stretches and exercises that I have included in your paperwork Try to reduce excess strain to the area and rest as much as possible  Wear supportive shoes and, if you must lift anything, use proper lifting techniques        ED Prescriptions   None    PDMP not reviewed this encounter.   Marylene Rocky BRAVO, PA-C 08/01/23 1112

## 2023-08-01 NOTE — Discharge Instructions (Signed)
 Please read and follow all provided instructions.  Your diagnoses today include:  1. Contusion of head, unspecified part of head, initial encounter   2. Neck pain   3. Acute right ankle pain   4. Acute pain of right knee   5. Fall, initial encounter     Tests performed today include: CT scan of your head and cervical spine that did not show any serious injury. X-ray of the knee and ankle did not show any signs of fracture, you do have some arthritis Vital signs. See below for your results today.   Medications prescribed:  Please use over-the-counter NSAID medications (ibuprofen, naproxen) or Tylenol  (acetaminophen ) as directed on the packaging for pain -- as long as you do not have any reasons avoid these medications. Reasons to avoid NSAID medications include: weak kidneys, a history of bleeding in your stomach or gut, or uncontrolled high blood pressure or previous heart attack. Reasons to avoid Tylenol  include: liver problems or ongoing alcohol use. Never take more than 4000mg  or 8 Extra strength Tylenol  in a 24 hour period.     Take any prescribed medications only as directed.  Home care instructions:  Follow any educational materials contained in this packet.  BE VERY CAREFUL not to take multiple medicines containing Tylenol  (also called acetaminophen ). Doing so can lead to an overdose which can damage your liver and cause liver failure and possibly death.   Follow-up instructions: Please follow-up with your primary care provider as needed for symptoms if not improving.  Return instructions:  SEEK IMMEDIATE MEDICAL ATTENTION IF: There is confusion or drowsiness (although children frequently become drowsy after injury).  You cannot awaken the injured person.  You have more than one episode of vomiting.  You notice dizziness or unsteadiness which is getting worse, or inability to walk.  You have convulsions or unconsciousness.  You experience severe, persistent headaches not  relieved by Tylenol . You cannot use arms or legs normally.  There are changes in pupil sizes. (This is the black center in the colored part of the eye)  There is clear or bloody discharge from the nose or ears.  You have change in speech, vision, swallowing, or understanding.  Localized weakness, numbness, tingling, or change in bowel or bladder control. You have any other emergent concerns.  Additional Information: You have had a head injury which does not appear to require admission at this time.  Your vital signs today were: BP (!) 143/68   Pulse 72   Temp 98.6 F (37 C) (Oral)   Resp 20   Ht 5' 3 (1.6 m)   Wt 122.5 kg   SpO2 96%   BMI 47.84 kg/m  If your blood pressure (BP) was elevated above 135/85 this visit, please have this repeated by your doctor within one month. --------------

## 2023-08-01 NOTE — ED Notes (Signed)
 Two ice packs provided to patient for pain management.

## 2023-08-01 NOTE — ED Provider Notes (Signed)
 [place] EMERGENCY DEPARTMENT AT [facility] Provider Note   CSN: [id number] Arrival date & time: 08/01/23  1137     Patient presents with: Fall and Head Injury   Shelby Day is a 78 y.o. female.   Patient not on anticoagulation presents to the emergency department for evaluation of head injury after a fall.  Patient was at church at around 8 AM.  She states that her shoe got stuck on the carpet.  She fell forward striking her forehead on the ground.  She landed on her right knee.  She had pain in her right knee and right ankle with bearing weight after the fall.  She has had some soreness in her neck with turning her neck.  No confusion or vomiting.  She did not lose consciousness.  No weakness, numbness, or tingling in her extremities.  Patient presented to urgent care and then was referred to the emergency department for imaging.  No complaints currently other than feeling hungry.       Prior to Admission medications   Medication Sig Start Date End Date Taking? Authorizing Provider  amoxicillin  (AMOXIL ) 500 MG tablet //take 2 by mouth one hour before dentist cleaning, then 2 by mouth six hours after appointment 08/19/17   Vernetta Lonni GRADE, MD  Ascorbic Acid  (VITAMIN C ) 1000 MG tablet Take 1,000 mg by mouth every morning.    [provider]  aspirin  81 MG chewable tablet Chew 1 tablet (81 mg total) by mouth 2 (two) times daily. 07/11/17   Vernetta Lonni GRADE, MD  calcium  carbonate (OSCAL) 1500 (600 Ca) MG TABS tablet Take 600 mg of elemental calcium  by mouth daily with breakfast.    [provider]  Cholecalciferol  (VITAMIN D3) 5000 units TABS Take 5,000 Units by mouth every morning.    [provider]  citalopram  (CELEXA ) 10 MG tablet Take 10 mg by mouth every morning.  04/09/17   [provider]  Glucosamine-MSM-Hyaluronic Acd (JOINT HEALTH PO) Take 1 tablet by mouth every morning.    [provider]   hydrochlorothiazide  (HYDRODIURIL ) 25 MG tablet Take 12.5 mg by mouth every morning.  04/06/17   [provider]  HYDROcodone -acetaminophen  (NORCO/VICODIN) 5-325 MG tablet Take 1-2 tablets by mouth every 4 (four) hours as needed for moderate pain (pain score 4-6). 07/11/17   Vernetta Lonni GRADE, MD  Krill Oil 500 MG CAPS Take 500 mg by mouth every morning.    [provider]  MELATONIN PO Take 0.5 tablets by mouth at bedtime as needed (sleep).    [provider]  methocarbamol  (ROBAXIN ) 500 MG tablet Take 1 tablet (500 mg total) by mouth every 6 (six) hours as needed for muscle spasms. 07/11/17   Vernetta Lonni GRADE, MD  Multiple Vitamin (MULTIVITAMIN WITH MINERALS) TABS tablet Take 2 tablets by mouth every morning.    [provider]    Allergies: Codeine    Review of Systems  Updated Vital Signs BP (!) 143/68   Pulse 72   Temp 98.6 F (37 C) (Oral)   Resp 20   Ht 5' 3 (1.6 m)   Wt 122.5 kg   SpO2 96%   BMI 47.84 kg/m   Physical Exam Vitals and nursing note reviewed.  Constitutional:      Appearance: She is well-developed.  HENT:     Head: Normocephalic. No raccoon eyes or Battle's sign.     Comments: Forehead abrasion noted    Right Ear: Tympanic membrane, ear canal  and external ear normal. No hemotympanum.     Left Ear: Tympanic membrane, ear canal and external ear normal. No hemotympanum.     Nose: Nose normal.     Mouth/Throat:     Pharynx: Uvula midline.  Eyes:     General: Lids are normal.     Extraocular Movements:     Right eye: No nystagmus.     Left eye: No nystagmus.     Conjunctiva/sclera: Conjunctivae normal.     Pupils: Pupils are equal, round, and reactive to light.     Comments: No visible hyphema noted  Cardiovascular:     Rate and Rhythm: Normal rate and regular rhythm.  Pulmonary:     Effort: Pulmonary effort is normal.     Breath sounds: Normal breath sounds.  Abdominal:     Palpations: Abdomen is soft.      Tenderness: There is no abdominal tenderness.  Musculoskeletal:     Cervical back: Normal range of motion and neck supple. Tenderness present. No bony tenderness.     Thoracic back: Tenderness present. No bony tenderness.     Lumbar back: No tenderness or bony tenderness.       Back:     Right hip: No tenderness. Normal range of motion.     Left hip: No tenderness. Normal range of motion.     Right knee: Normal range of motion. Tenderness present.     Left knee: Normal range of motion. No tenderness.     Right ankle: Tenderness present. Normal range of motion.     Left ankle: No tenderness. Normal range of motion.  Skin:    General: Skin is warm and dry.  Neurological:     Mental Status: She is alert and oriented to person, place, and time.     GCS: GCS eye subscore is 4. GCS verbal subscore is 5. GCS motor subscore is 6.     Cranial Nerves: No cranial nerve deficit.     Sensory: No sensory deficit.     Coordination: Coordination normal.     (all labs ordered are listed, but only abnormal results are displayed) Labs Reviewed - No data to display  EKG: None  Radiology: DG Ankle Complete Right Result Date: 08/01/2023 CLINICAL DATA:  Right ankle pain after fall. EXAM: RIGHT ANKLE - COMPLETE 3+ VIEW COMPARISON:  None Available. FINDINGS: There is no evidence of fracture, dislocation, or joint effusion. There is no evidence of arthropathy or other focal bone abnormality. Soft tissues are unremarkable. IMPRESSION: Negative. Electronically Signed   By: Lynwood Landy Raddle M.D.   On: 08/01/2023 12:44   CT Head Wo Contrast Result Date: 08/01/2023 CLINICAL DATA:  Head and neck trauma EXAM: CT HEAD WITHOUT CONTRAST CT CERVICAL SPINE WITHOUT CONTRAST TECHNIQUE: Multidetector CT imaging of the head and cervical spine was performed following the standard protocol without intravenous contrast. Multiplanar CT image reconstructions of the cervical spine were also generated. RADIATION DOSE  REDUCTION: This exam was performed according to the departmental dose-optimization program which includes automated exposure control, adjustment of the mA and/or kV according to patient size and/or use of iterative reconstruction technique. COMPARISON:  None Available. FINDINGS: CT HEAD FINDINGS Brain: No evidence of acute infarction, hemorrhage, hydrocephalus, extra-axial collection or mass lesion/mass effect. Periventricular white matter hypodensity. Vascular: No hyperdense vessel or unexpected calcification. Skull: Normal. Negative for fracture or focal lesion. Sinuses/Orbits: No acute finding. Other: Soft tissue contusion of the midline forehead (series 2, image 22). CT CERVICAL SPINE FINDINGS  Alignment: Normal. Skull base and vertebrae: No acute fracture. No primary bone lesion or focal pathologic process. Soft tissues and spinal canal: No prevertebral fluid or swelling. No visible canal hematoma. Disc levels: Mild disc degenerative change of the lower cervical levels. Upper chest: Negative. Other: None. IMPRESSION: 1. No acute intracranial pathology. Small-vessel white matter disease. 2. Soft tissue contusion of the midline forehead. 3. No fracture or static subluxation of the cervical spine. 4. Mild disc degenerative change of the lower cervical levels. Electronically Signed   By: Marolyn JONETTA Jaksch M.D.   On: 08/01/2023 12:43   CT Cervical Spine Wo Contrast Result Date: 08/01/2023 CLINICAL DATA:  Head and neck trauma EXAM: CT HEAD WITHOUT CONTRAST CT CERVICAL SPINE WITHOUT CONTRAST TECHNIQUE: Multidetector CT imaging of the head and cervical spine was performed following the standard protocol without intravenous contrast. Multiplanar CT image reconstructions of the cervical spine were also generated. RADIATION DOSE REDUCTION: This exam was performed according to the departmental dose-optimization program which includes automated exposure control, adjustment of the mA and/or kV according to patient size and/or  use of iterative reconstruction technique. COMPARISON:  None Available. FINDINGS: CT HEAD FINDINGS Brain: No evidence of acute infarction, hemorrhage, hydrocephalus, extra-axial collection or mass lesion/mass effect. Periventricular white matter hypodensity. Vascular: No hyperdense vessel or unexpected calcification. Skull: Normal. Negative for fracture or focal lesion. Sinuses/Orbits: No acute finding. Other: Soft tissue contusion of the midline forehead (series 2, image 22). CT CERVICAL SPINE FINDINGS Alignment: Normal. Skull base and vertebrae: No acute fracture. No primary bone lesion or focal pathologic process. Soft tissues and spinal canal: No prevertebral fluid or swelling. No visible canal hematoma. Disc levels: Mild disc degenerative change of the lower cervical levels. Upper chest: Negative. Other: None. IMPRESSION: 1. No acute intracranial pathology. Small-vessel white matter disease. 2. Soft tissue contusion of the midline forehead. 3. No fracture or static subluxation of the cervical spine. 4. Mild disc degenerative change of the lower cervical levels. Electronically Signed   By: Marolyn JONETTA Jaksch M.D.   On: 08/01/2023 12:43   DG Knee Complete 4 Views Right Result Date: 08/01/2023 CLINICAL DATA:  Right knee pain after fall. EXAM: RIGHT KNEE - COMPLETE 4+ VIEW COMPARISON:  June 22, 2018. FINDINGS: No evidence of fracture, dislocation, or joint effusion. Moderate narrowing of medial joint space is noted with osteophyte formation. Mild narrowing of patellofemoral space is noted as well. Minimal osteophyte formation is noted laterally. Soft tissues are unremarkable. 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 No acute abnormality seen. Electronically Signed   By: Lynwood Landy Raddle M.D.   On: 08/01/2023 12:42     Procedures   Medications Ordered in the ED - No data to display  ED Course  Patient seen and examined. History obtained directly from patient.   Labs/EKG: None  ordered  Imaging: Ordered CT head and cervical spine, x-ray of the right knee and ankle..  Medications/Fluids: None ordered  Most recent vital signs reviewed and are as follows: BP (!) 143/68   Pulse 72   Temp 98.6 F (37 C) (Oral)   Resp 20   Ht 5' 3 (1.6 m)   Wt 122.5 kg   SpO2 96%   BMI 47.84 kg/m   Initial impression: Fall, no loss of consciousness.  Imaging ordered.  1:21 PM Reassessment performed. Patient appears stable.  No decompensation.  Imaging personally visualized and interpreted including: CT of the head and cervical spine, I agree no fracture or acute signs of trauma.  X-ray of the right ankle and right knee agree no signs of fracture or dislocation.  Reviewed pertinent lab work and imaging with patient at bedside. Questions answered.   Most current vital signs reviewed and are as follows: BP (!) 142/81   Pulse 70   Temp 98.6 F (37 C) (Oral)   Resp 20   Ht 5' 3 (1.6 m)   Wt 122.5 kg   SpO2 95%   BMI 47.84 kg/m   Plan: Discharge to home.   Prescriptions written for: None  Other home care instructions discussed: RICE protocol, over-the-counter medications for pain and discomfort  ED return instructions discussed: Patient was counseled on head injury precautions and symptoms that should indicate their return to the ED.  These include severe worsening headache, vision changes, confusion, loss of consciousness, trouble walking, nausea & vomiting, or weakness/tingling in extremities.    Follow-up instructions discussed: Patient encouraged to follow-up with their PCP in 7 days with any persistent symptoms that are bothersome.                                  Medical Decision Making Amount and/or Complexity of Data Reviewed Radiology: ordered.   Patient with mechanical fall.  She sustained a minor head injury.  She has had some neck pain, knee pain and ankle pain.  All imaging without signs of significant trauma.  Patient without any decompensation  neurologically during ED stay.  She looks well.  Will treat symptomatically with routine care at this point.     Final diagnoses:  Contusion of head, unspecified part of head, initial encounter  Neck pain  Acute right ankle pain  Acute pain of right knee  Fall, initial encounter    ED Discharge Orders     None          Desiderio Chew, PA-C 08/01/23 1323    Tonia Chew, MD 08/01/23 1443

## 2023-08-01 NOTE — Discharge Instructions (Addendum)
 You were seen today for concerns of injuries sustained after a fall earlier this AM. Your neurological exam is reassuring but given the mechanism of your fall and injuries I strongly recommend going to the ED for a head CT scan to ensure there isn't an intracranial injury or bleed that may cause further issues.You have decided to go to a nearby Med Center by private vehicle for evaluation. If you start having more severe symptoms or cannot complete the journey to the ED please call 911 for further assistance.  Your right knee and ankle do not show obvious signs of fracture or injury at this time and should respond well to home measures as outlined below:  Rest Warm compresses to the area (20 minutes on, minimum of 30 minutes off) You can alternate Tylenol  and Ibuprofen for pain management but Ibuprofen is typically preferred to reduce inflammation.   Gentle stretches and exercises that I have included in your paperwork Try to reduce excess strain to the area and rest as much as possible  Wear supportive shoes and, if you must lift anything, use proper lifting techniques

## 2023-08-01 NOTE — ED Triage Notes (Addendum)
 Arrives POV with complaints of having a mechanical fall earlier today, hitting her head. Patient does not take blood thinners, no LOC. Sent here for imaging of head by Urgent Care.   ---also reports right knee and right ankle pain

## 2023-08-01 NOTE — ED Notes (Signed)
 Patient is being discharged from the Urgent Care and sent to the Emergency Department via private vehicle with spouse . Per Rocky Mecum PA, patient is in need of higher level of care due to fall with head injury. Patient is aware and verbalizes understanding of plan of care.  Vitals:   08/01/23 1015  BP: (!) 146/83  Pulse: 78  Resp: 20  Temp: 97.8 F (36.6 C)  SpO2: 95%

## 2023-08-01 NOTE — ED Triage Notes (Addendum)
 Pt presents with complaint of fall this morning at church at approximately 8 AM. States she slammed her forehead on a platform that was holding up the camera. Pt states her forehead did bleed. Blood on shirt currently. Currently rates overall pain a 1/10. States she is aching all over, pointing to right knee and right ankle. Not on blood thinners. Denies LOC, Aox4.

## 2023-11-29 ENCOUNTER — Encounter: Payer: Self-pay | Admitting: Nurse Practitioner

## 2023-11-29 NOTE — Progress Notes (Signed)
 This encounter was created in error - please disregard.

## 2023-12-28 ENCOUNTER — Other Ambulatory Visit: Payer: Self-pay

## 2023-12-28 ENCOUNTER — Inpatient Hospital Stay (HOSPITAL_COMMUNITY)

## 2023-12-28 ENCOUNTER — Emergency Department (HOSPITAL_BASED_OUTPATIENT_CLINIC_OR_DEPARTMENT_OTHER)

## 2023-12-28 ENCOUNTER — Observation Stay (HOSPITAL_BASED_OUTPATIENT_CLINIC_OR_DEPARTMENT_OTHER)
Admission: EM | Admit: 2023-12-28 | Discharge: 2023-12-29 | Disposition: A | Attending: Internal Medicine | Admitting: Internal Medicine

## 2023-12-28 DIAGNOSIS — I639 Cerebral infarction, unspecified: Secondary | ICD-10-CM | POA: Diagnosis present

## 2023-12-28 DIAGNOSIS — R29818 Other symptoms and signs involving the nervous system: Secondary | ICD-10-CM | POA: Diagnosis present

## 2023-12-28 DIAGNOSIS — K5730 Diverticulosis of large intestine without perforation or abscess without bleeding: Secondary | ICD-10-CM | POA: Insufficient documentation

## 2023-12-28 DIAGNOSIS — Z743 Need for continuous supervision: Secondary | ICD-10-CM | POA: Diagnosis not present

## 2023-12-28 DIAGNOSIS — R200 Anesthesia of skin: Principal | ICD-10-CM

## 2023-12-28 DIAGNOSIS — G459 Transient cerebral ischemic attack, unspecified: Secondary | ICD-10-CM | POA: Diagnosis not present

## 2023-12-28 DIAGNOSIS — R531 Weakness: Secondary | ICD-10-CM | POA: Diagnosis not present

## 2023-12-28 LAB — COMPREHENSIVE METABOLIC PANEL WITH GFR
ALT: 31 U/L (ref 0–44)
AST: 35 U/L (ref 15–41)
Albumin: 4.4 g/dL (ref 3.5–5.0)
Alkaline Phosphatase: 90 U/L (ref 38–126)
Anion gap: 10 (ref 5–15)
BUN: 13 mg/dL (ref 8–23)
CO2: 27 mmol/L (ref 22–32)
Calcium: 9.8 mg/dL (ref 8.9–10.3)
Chloride: 106 mmol/L (ref 98–111)
Creatinine, Ser: 0.77 mg/dL (ref 0.44–1.00)
GFR, Estimated: >60 mL/min (ref >60)
Glucose, Bld: 121 mg/dL — ABNORMAL HIGH (ref 70–99)
Potassium: 3.8 mmol/L (ref 3.5–5.1)
Sodium: 143 mmol/L (ref 135–145)
Total Bilirubin: 0.6 mg/dL (ref 0.0–1.2)
Total Protein: 6.8 g/dL (ref 6.5–8.1)

## 2023-12-28 LAB — DIFFERENTIAL
Abs Immature Granulocytes: 0.01 K/uL (ref 0.00–0.07)
Basophils Absolute: 0.1 K/uL (ref 0.0–0.1)
Basophils Relative: 1 %
Eosinophils Absolute: 0.3 K/uL (ref 0.0–0.5)
Eosinophils Relative: 5 %
Immature Granulocytes: 0 %
Lymphocytes Relative: 43 %
Lymphs Abs: 2.2 K/uL (ref 0.7–4.0)
Monocytes Absolute: 0.5 K/uL (ref 0.1–1.0)
Monocytes Relative: 9 %
Neutro Abs: 2.2 K/uL (ref 1.7–7.7)
Neutrophils Relative %: 42 %

## 2023-12-28 LAB — CBC
HCT: 44.1 % (ref 36.0–46.0)
Hemoglobin: 15.5 g/dL — ABNORMAL HIGH (ref 12.0–15.0)
MCH: 33.7 pg (ref 26.0–34.0)
MCHC: 35.1 g/dL (ref 30.0–36.0)
MCV: 95.9 fL (ref 80.0–100.0)
Platelets: 222 K/uL (ref 150–400)
RBC: 4.6 MIL/uL (ref 3.87–5.11)
RDW: 12.4 % (ref 11.5–15.5)
WBC: 5.2 K/uL (ref 4.0–10.5)
nRBC: 0 % (ref 0.0–0.2)

## 2023-12-28 LAB — PROTIME-INR
INR: 1 (ref 0.8–1.2)
Prothrombin Time: 13.2 s (ref 11.4–15.2)

## 2023-12-28 LAB — ETHANOL: Alcohol, Ethyl (B): <15 mg/dL (ref <15)

## 2023-12-28 LAB — CBG MONITORING, ED: Glucose-Capillary: 152 mg/dL — ABNORMAL HIGH (ref 70–99)

## 2023-12-28 LAB — APTT: aPTT: 27 s (ref 24–36)

## 2023-12-28 MED ORDER — GADOBUTROL 1 MMOL/ML IV SOLN
10.0000 mL | Freq: Once | INTRAVENOUS | Status: AC | PRN
  Administered 2023-12-28: 10 mL via INTRAVENOUS

## 2023-12-28 MED ORDER — SODIUM CHLORIDE 0.9% FLUSH: 3.0000 mL | Freq: Once | INTRAVENOUS | Status: DC

## 2023-12-28 MED ORDER — ENOXAPARIN SODIUM 40 MG/0.4ML IJ SOSY
40.0000 mg | PREFILLED_SYRINGE | INTRAMUSCULAR | Status: DC
  Administered 2023-12-28: 40 mg via SUBCUTANEOUS
  Filled 2023-12-28: qty 0.4

## 2023-12-28 MED ORDER — HYDROCHLOROTHIAZIDE 12.5 MG PO TABS
12.5000 mg | ORAL_TABLET | Freq: Every morning | ORAL | Status: DC
  Administered 2023-12-29: 12.5 mg via ORAL
  Filled 2023-12-28: qty 1

## 2023-12-28 MED ORDER — STROKE: EARLY STAGES OF RECOVERY BOOK
Freq: Once | Status: AC
  Filled 2023-12-28: qty 1

## 2023-12-28 MED ORDER — ACETAMINOPHEN 160 MG/5ML PO SOLN: 650.0000 mg | ORAL | Status: DC | PRN

## 2023-12-28 MED ORDER — FUROSEMIDE 20 MG PO TABS
20.0000 mg | ORAL_TABLET | Freq: Every day | ORAL | Status: DC
  Administered 2023-12-28 – 2023-12-29 (×2): 20 mg via ORAL
  Filled 2023-12-28 (×2): qty 1

## 2023-12-28 MED ORDER — ACETAMINOPHEN 650 MG RE SUPP: 650.0000 mg | RECTAL | Status: DC | PRN

## 2023-12-28 MED ORDER — SODIUM CHLORIDE 0.9 % IV SOLN: INTRAVENOUS | Status: DC

## 2023-12-28 MED ORDER — ACETAMINOPHEN 325 MG PO TABS: 650.0000 mg | ORAL_TABLET | ORAL | Status: DC | PRN

## 2023-12-28 MED ORDER — CITALOPRAM HYDROBROMIDE 10 MG PO TABS
10.0000 mg | ORAL_TABLET | Freq: Every morning | ORAL | Status: DC
  Administered 2023-12-29: 10 mg via ORAL
  Filled 2023-12-28: qty 1

## 2023-12-28 MED ORDER — LOSARTAN POTASSIUM 25 MG PO TABS
25.0000 mg | ORAL_TABLET | Freq: Every day | ORAL | Status: DC
  Administered 2023-12-29: 25 mg via ORAL
  Filled 2023-12-28: qty 1

## 2023-12-28 NOTE — ED Notes (Signed)
 After speaking with EDP, patient states her LKN would be last night at 2100 d/t to patient adding to story that she woke up feeling generalized weakness.  Numbness starting at 1200.

## 2023-12-28 NOTE — ED Notes (Signed)
 Pt & family member advised of ready-bed at [facility]. [facility] called for transport

## 2023-12-28 NOTE — ED Notes (Signed)
 Report attempted to 3W at this time; RN advises she is giving medications in another room, requests call back in approx 

## 2023-12-28 NOTE — ED Notes (Signed)
 Called Thomas at [facility] for transport 18:02-TC

## 2023-12-28 NOTE — ED Notes (Signed)
 While in CT, ED Provider at CT table, doing his assessment before Ct can be preformed. Tele-neurologist at bedside as well.

## 2023-12-28 NOTE — ED Provider Notes (Signed)
 [name] EMERGENCY DEPARTMENT AT [facility] Provider Note   CSN: [id number] Arrival date & time: 12/28/23  1251     Patient presents with: Weakness   Shelby Day is a 78 y.o. female.    Weakness Patient presents with weakness numbness that she noticed worse after shower at noon.  However with further questioning finds out that she was feeling off when she got up this morning.  Went to bed at 11:00 last night.  States that had some blurred vision.  States left side of face felt off.  Also states left side felt numb.  States feeling somewhat better now.  No headache.  No confusion.    Past Medical History:  Diagnosis Date   Arthritis    end-stage left hip   Diverticulitis 03/23/2007   Noted on CT pelvis: Mild to moderate of prox. sigmoid colon   Diverticulosis of sigmoid colon 03/23/2007   Noted on CT pelvis   History of cataract    Bilateral   History of kidney stones    Hypertension    Sleep apnea    USES CPAP    Prior to Admission medications   Medication Sig Start Date End Date Taking? Authorizing Provider  losartan  (COZAAR ) 25 MG tablet Take 25 mg by mouth daily. 12/01/23  Yes [provider]  amoxicillin  (AMOXIL ) 500 MG tablet //take 2 by mouth one hour before dentist cleaning, then 2 by mouth six hours after appointment 08/19/17   Vernetta Lonni GRADE, MD  Ascorbic Acid  (VITAMIN C ) 1000 MG tablet Take 1,000 mg by mouth every morning.    [provider]  aspirin  81 MG chewable tablet Chew 1 tablet (81 mg total) by mouth 2 (two) times daily. 07/11/17   Vernetta Lonni GRADE, MD  calcium  carbonate (OSCAL) 1500 (600 Ca) MG TABS tablet Take 600 mg of elemental calcium  by mouth daily with breakfast.    [provider]  Cholecalciferol  (VITAMIN D3) 5000 units TABS Take 5,000 Units by mouth every morning.    [provider]  citalopram  (CELEXA ) 10 MG tablet Take 10 mg by mouth every morning.  04/09/17   [provider]  furosemide  (LASIX ) 20 MG tablet Take 20 mg by mouth daily.    [provider]  Glucosamine-MSM-Hyaluronic Acd (JOINT HEALTH PO) Take 1 tablet by mouth every morning.    [provider]  hydrochlorothiazide  (HYDRODIURIL ) 25 MG tablet Take 12.5 mg by mouth every morning.  04/06/17   [provider]  HYDROcodone -acetaminophen  (NORCO/VICODIN) 5-325 MG tablet Take 1-2 tablets by mouth every 4 (four) hours as needed for moderate pain (pain score 4-6). 07/11/17   Vernetta Lonni GRADE, MD  Krill Oil 500 MG CAPS Take 500 mg by mouth every morning.    [provider]  MELATONIN PO Take 0.5 tablets by mouth at bedtime as needed (sleep).    [provider]  methocarbamol  (ROBAXIN ) 500 MG tablet Take 1 tablet (500 mg total) by mouth every 6 (six) hours as needed for muscle spasms. 07/11/17   Vernetta Lonni GRADE, MD  Multiple Vitamin (MULTIVITAMIN WITH MINERALS) TABS tablet Take 2 tablets by mouth every morning.    [provider]    Allergies: Codeine    Review of Systems  Neurological:  Positive for weakness.    Updated Vital Signs BP (!) 155/82   Pulse 96   Temp 98.3 F (36.8 C) (Oral)   Resp 18   SpO2 100%   Physical Exam Vitals and  nursing note reviewed.  HENT:     Head: Atraumatic.  Cardiovascular:     Rate and Rhythm: Regular rhythm.  Abdominal:     Tenderness: There is no abdominal tenderness.  Neurological:     Mental Status: She is alert.     Comments: Paresthesias to left face.  Face symmetric.  Eye movements intact.  Visual fields are grossly intact by confrontation.  No drift on either upper or lower extremities.  No Romberg.  Normal gait.      (all labs ordered are listed, but only abnormal results are displayed) Labs Reviewed  CBC - Abnormal; Notable for the following components:      Result Value   Hemoglobin 15.5 (*)    All other components within normal limits  COMPREHENSIVE METABOLIC PANEL WITH GFR -  Abnormal; Notable for the following components:   Glucose, Bld 121 (*)    All other components within normal limits  CBG MONITORING, ED - Abnormal; Notable for the following components:   Glucose-Capillary 152 (*)    All other components within normal limits  PROTIME-INR  APTT  DIFFERENTIAL  ETHANOL  CBG MONITORING, ED    EKG: None  Radiology: CT HEAD CODE STROKE WO CONTRAST Result Date: 12/28/2023 EXAM: CT HEAD WITHOUT 12/28/2023 01:10:23 PM TECHNIQUE: CT of the head was performed without the administration of intravenous contrast. Automated exposure control, iterative reconstruction, and/or weight based adjustment of the mA/kV was utilized to reduce the radiation dose to as low as reasonably achievable. COMPARISON: 08/01/2023 CLINICAL HISTORY: Neuro deficit, acute, stroke suspected. FINDINGS: BRAIN AND VENTRICLES: No acute intracranial hemorrhage. No mass effect or midline shift. No extra-axial fluid collection. No evidence of acute infarct. No hydrocephalus. 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 Nonspecific hypoattenuation in the periventricular and subcortical white matter, most likely representing chronic microvascular ischemic changes. Mild parenchymal volume loss. Atherosclerosis of the carotid siphons. [place] Stroke Program Early CT (ASPECT) score: Ganglionic (caudate, IC, lentiform nucleus, insula, M1-M3): 7. Supraganglionic (M4-M6): 3. Total: 10. ORBITS: Bilateral lens replacement. SINUSES AND MASTOIDS: No acute abnormality. SOFT TISSUES AND SKULL: No acute skull fracture. No acute soft tissue abnormality. IMPRESSION: 1. No acute intracranial abnormality. 2. ASPECTS 10. 3. 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 Electronically signed by: Donnice Mania MD 12/28/2023 01:21 PM EST RP Workstation: [id number]     Procedures   Medications Ordered in the ED  sodium chloride  flush (NS) 0.9 % injection 3 mL  (3 mLs Intravenous Not Given 12/28/23 1352)                                    Medical Decision Making Amount and/or Complexity of Data Reviewed Labs: ordered. Radiology: ordered.   Patient presented with focal neurologic deficits.  Left-sided face paresthesia.  Potentially had had some weakness in the left side but seems to improved.  Overall reassuring neurologic exam.  Discussed with Dr. Germaine from neurology.  Not a TNK candidate due to last normal of 11 PM last night.  Not LVO positive so no neuro IR at this time.  Initial head CT reassuring.  No bleed.  Neurology recommendations put in.  Will require admission to hospital for further workup.  Would need to did have severe changes and be LVO positive for thrombectomy.  Neurology recommendations.  Recommendations:  1. HgbA1c, fasting lipid panel.  Goal A1c<7.0, goal  LDL<70 2. MRI of the brain without contrast, MRA of the head and neck 3. PT consult, OT consult, Speech consult 4. Echocardiogram with bubble study 5. Prophylactic therapy-ASA 325mg  now.  Would start dual antiplatelet therapy for three weeks consisting of ASA 81mg  and Plavix  75mg  daily before discontinuing Plavix  and continuing ASA 81mg  daily as monotherapy 7. NPO until RN stroke swallow screen 8. Telemetry monitoring 9. Frequent neuro checks.  If significant worsening of neurological examination would require re-evaluation for appropriateness for thrombectomy.    10. High intensity statin initiation 11.  Permissive BP management with no treatment of SBP<200 for the first 24-48 hours.  Goal BP after that time <140/80    CRITICAL CARE Performed by: [place] Total critical care time: 30 minutes Critical care time was exclusive of separately billable procedures and treating other patients. Critical care was necessary to treat or prevent imminent or life-threatening deterioration. Critical care was time spent personally by me on the following activities:  development of treatment plan with patient and/or surrogate as well as nursing, discussions with consultants, evaluation of patient's response to treatment, examination of patient, obtaining history from patient or surrogate, ordering and performing treatments and interventions, ordering and review of laboratory studies, ordering and review of radiographic studies, pulse oximetry and re-evaluation of patient's condition.      Final diagnoses:  Numbness    ED Discharge Orders     None          River Rankin, MD 12/28/23 1413

## 2023-12-28 NOTE — Consult Note (Signed)
 [facility] Telemedicine Consult   Requesting Provider: Patsey Consult Participants: Nurse, ED physician Location of the provider: [facility] Location of the patient: [facility]  This consult was provided via telemedicine with 2-way video and audio communication. The patient/family was informed that care would be provided in this way and agreed to receive care in this manner.    Chief Complaint: Left facial numbness and left sided weakness  HPI: 78 year old female with a history of HTN, OSA, and arthritis who reports going to bed last evening at baseline.  Awakened this morning and noted she was having some difficulty with her vision that was not usual for her.  Then after he shower noted left facial numbness and some weakness on the left side of the body.  With no improvement presented for evaluation.   Patient on no antithrombotic or anticoagulation medications at home.     LKW: 12/27/2023 @ 2300 tpa given?: No, Outside time window, nondisabling symptoms IR Thrombectomy? No, Nondisabling symptoms Modified Rankin Scale: 0-Completely asymptomatic and back to baseline post- stroke Page Time: 1257 Time of teleneurologist evaluation: 1259  Exam: Vitals:   12/28/23 1258  BP: (!) 155/82  Pulse: 96  Resp: 18  Temp: 98.3 F (36.8 C)  SpO2: 100%    General: NAD  1A: Level of Consciousness - 0 1B: Ask Month and Age - 0 1C: 'Blink Eyes' & 'Squeeze Hands' - 0 2: Test Horizontal Extraocular Movements - 0 3: Test Visual Fields - 0 4: Test Facial Palsy - 0 5A: Test Left Arm Motor Drift - 0 5B: Test Right Arm Motor Drift - 0 6A: Test Left Leg Motor Drift - 0 6B: Test Right Leg Motor Drift - 0 7: Test Limb Ataxia - 0 8: Test Sensation - 1  (left face) 9: Test Language/Aphasia- 0 10: Test Dysarthria - 0 11: Test Extinction/Inattention - 0 NIHSS score: 1   Imaging Reviewed:  CT HEAD WITHOUT 12/28/2023 01:10:23 PM   TECHNIQUE: CT of the head was performed without the  administration of intravenous contrast. Automated exposure control, iterative reconstruction, and/or weight based adjustment of the mA/kV was utilized to reduce the radiation dose to as low as reasonably achievable.   COMPARISON: 08/01/2023   CLINICAL HISTORY: Neuro deficit, acute, stroke suspected.   FINDINGS:   BRAIN AND VENTRICLES: No acute intracranial hemorrhage. No mass effect or midline shift. No extra-axial fluid collection. No evidence of acute infarct. No hydrocephalus. 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 Nonspecific hypoattenuation in the periventricular and subcortical white matter, most likely representing chronic microvascular ischemic changes. Mild parenchymal volume loss. Atherosclerosis of the carotid siphons. [place] Stroke Program Early CT (ASPECT) score: Ganglionic (caudate, IC, lentiform nucleus, insula, M1-M3): 7. Supraganglionic (M4-M6): 3. Total: 10.   ORBITS: Bilateral lens replacement.   SINUSES AND MASTOIDS: No acute abnormality.   SOFT TISSUES AND SKULL: No acute skull fracture. No acute soft tissue abnormality.   IMPRESSION: 1. No acute intracranial abnormality. 2. ASPECTS 10. 3. 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  Labs reviewed in epic and pertinent values follow: 152   Assessment: 78 year old female with a history of HTN, OSA, and arthritis who reports going to bed last evening at baseline.  Awakened this morning and noted she was having some difficulty with her vision that was not usual for her.  Then after he shower noted left facial numbness and some weakness on the left side of the  body.  With no improvement presented for evaluation.   Patient on no antithrombotic or anticoagulation medications at home.  Symptoms now improved with only some mild sensory disturbance on the left side of the face. Small infarct versus TIA on the differential.  Further  work up recommended.     Recommendations:  1. HgbA1c, fasting lipid panel.  Goal A1c<7.0, goal LDL<70 2. MRI of the brain without contrast, MRA of the head and neck 3. PT consult, OT consult, Speech consult 4. Echocardiogram with bubble study 5. Prophylactic therapy-ASA 325mg  now.  Would start dual antiplatelet therapy for three weeks consisting of ASA 81mg  and Plavix  75mg  daily before discontinuing Plavix  and continuing ASA 81mg  daily as monotherapy 7. NPO until RN stroke swallow screen 8. Telemetry monitoring 9. Frequent neuro checks.  If significant worsening of neurological examination would require re-evaluation for appropriateness for thrombectomy.    10. High intensity statin initiation 11.  Permissive BP management with no treatment of SBP<200 for the first 24-48 hours.  Goal BP after that time <140/80   Case discussed with Dr. Patsey   This patient is receiving care for possible acute neurological changes. Care by this provider at the time of service included time for direct evaluation via telemedicine, review of medical records, imaging studies and discussion of findings with providers, the patient and/or family.  Sonny Hock, MD Neurology   If 8pm- 8am, please page neurology on call as listed in AMION.

## 2023-12-28 NOTE — ED Notes (Signed)
 Report to 3W RN given

## 2023-12-28 NOTE — ED Notes (Signed)
 In CT

## 2023-12-28 NOTE — H&P (Signed)
 History and Physical    Shelby Day FMW:[id number] DOB: 1945/11/04 DOA: 12/28/2023  PCP: Aisha Harvey, MD   Chief Complaint: facial numbness  HPI: Shelby Day is a 78 y.o. female with medical history significant of hypertension, sleep apnea who presented to outside facility with vision changes.  Last night she was her usual self and woke up with vision changes and facial numbness.  She presented to the ER for further assessment.  She was outside of her time window for tPA.  Neurology was consulted at outside facility and NIH stroke scale was 1.  CT head was obtained which showed no acute findings however repeat remote infarct.  Patient symptoms had improved and neurology recommended transfer to [facility] for TIA/stroke workup.  MRI and restratification labs were ordered on presentation.  Labs showed WBC 5.2, hemoglobin 15.5, CMP unrevealing.   Review of Systems: Review of Systems  All other systems reviewed and are negative.    As per HPI otherwise 10 point review of systems negative.   Allergies  Allergen Reactions   Codeine Nausea And Vomiting    Past Medical History:  Diagnosis Date   Arthritis    end-stage left hip   Diverticulitis 03/23/2007   Noted on CT pelvis: Mild to moderate of prox. sigmoid colon   Diverticulosis of sigmoid colon 03/23/2007   Noted on CT pelvis   History of cataract    Bilateral   History of kidney stones    Hypertension    Sleep apnea    USES CPAP    Past Surgical History:  Procedure Laterality Date   BLADDER TACH     BREAST CYST ASPIRATION     CATARACT EXTRACTION W/ INTRAOCULAR LENS  IMPLANT, BILATERAL  01/31/2014 and 02/07/2014   REFRACTIVE SURGERY Bilateral    20+ years ago LASIX    TOTAL HIP ARTHROPLASTY Left 07/09/2017   Procedure: LEFT TOTAL HIP ARTHROPLASTY ANTERIOR APPROACH;  Surgeon: Vernetta Lonni GRADE, MD;  Location: [facility];  Service: Orthopedics;  Laterality: Left;     reports that she has never smoked. She has  never used smokeless tobacco. She reports current alcohol use. She reports that she does not use drugs.  Family History  Problem Relation Age of Onset   Breast cancer Sister        diagnosed in her early 63's    Prior to Admission medications   Medication Sig Start Date End Date Taking? Authorizing Provider  Ascorbic Acid  (VITAMIN C ) 1000 MG tablet Take 1,000 mg by mouth every morning.   Yes [provider]  calcium  carbonate (OSCAL) 1500 (600 Ca) MG TABS tablet Take 600 mg of elemental calcium  by mouth daily with breakfast.   Yes [provider]  Cholecalciferol  (VITAMIN D3) 5000 units TABS Take 5,000 Units by mouth every morning.   Yes [provider]  citalopram  (CELEXA ) 10 MG tablet Take 10 mg by mouth every morning.  04/09/17  Yes [provider]  furosemide  (LASIX ) 20 MG tablet Take 20 mg by mouth daily.   Yes [provider]  Glucosamine-MSM-Hyaluronic Acd (JOINT HEALTH PO) Take 1 tablet by mouth every morning.   Yes [provider]  hydrochlorothiazide  (HYDRODIURIL ) 25 MG tablet Take 12.5 mg by mouth every morning.  04/06/17  Yes [provider]  Anselm Oil 500 MG CAPS Take 500 mg by mouth every morning.   Yes [provider]  losartan  (COZAAR ) 25 MG tablet Take 25 mg by mouth daily. 12/01/23  Yes [provider]  MELATONIN PO Take 0.5 tablets by mouth at bedtime as needed (sleep).   Yes [provider]  Multiple Vitamin (MULTIVITAMIN WITH MINERALS) TABS tablet Take 2 tablets by mouth every morning.   Yes [provider]  amoxicillin  (AMOXIL ) 500 MG tablet //take 2 by mouth one hour before dentist cleaning, then 2 by mouth six hours after appointment Patient not taking: Reported on 12/28/2023 08/19/17   Vernetta Lonni GRADE, MD  aspirin  81 MG chewable tablet Chew 1 tablet (81 mg total) by mouth 2 (two) times daily. Patient not taking: Reported on 12/28/2023 07/11/17   Vernetta Lonni GRADE,  MD  HYDROcodone -acetaminophen  (NORCO/VICODIN) 5-325 MG tablet Take 1-2 tablets by mouth every 4 (four) hours as needed for moderate pain (pain score 4-6). Patient not taking: Reported on 12/28/2023 07/11/17   Vernetta Lonni GRADE, MD  methocarbamol  (ROBAXIN ) 500 MG tablet Take 1 tablet (500 mg total) by mouth every 6 (six) hours as needed for muscle spasms. Patient not taking: Reported on 12/28/2023 07/11/17   Vernetta Lonni GRADE, MD    Physical Exam: Vitals:   12/28/23 1258 12/28/23 1730 12/28/23 1913  BP: (!) 155/82 132/62 (!) 147/75  Pulse: 96 66 66  Resp: 18 16   Temp: 98.3 F (36.8 C)  98.8 F (37.1 C)  TempSrc: Oral  Oral  SpO2: 100% 98% 95%   Physical Exam Vitals reviewed.  Constitutional:      Appearance: She is normal weight.  HENT:     Head: Normocephalic and atraumatic.     Mouth/Throat:     Mouth: Mucous membranes are moist.     Pharynx: Oropharynx is clear.  Eyes:     Conjunctiva/sclera: Conjunctivae normal.     Pupils: Pupils are equal, round, and reactive to light.  Cardiovascular:     Rate and Rhythm: Normal rate and regular rhythm.     Pulses: Normal pulses.     Heart sounds: Normal heart sounds.  Pulmonary:     Effort: Pulmonary effort is normal.     Breath sounds: Normal breath sounds.  Abdominal:     General: Abdomen is flat. Bowel sounds are normal.  Musculoskeletal:        General: Normal range of motion.     Cervical back: Normal range of motion.  Skin:    General: Skin is warm.     Capillary Refill: Capillary refill takes less than 2 seconds.  Neurological:     General: No focal deficit present.     Mental Status: She is alert.  Psychiatric:        Mood and Affect: Mood normal.       Labs on Admission: I have personally reviewed the patients's labs and imaging studies.  Assessment/Plan Principal Problem:   Focal neurological deficit Active Problems:   CVA (cerebral vascular accident) (HCC)   # Concern for TIA # Facial  numbness - Last known normal was 11 PM yesterday - CT head unrevealing - Teleneurology consultation obtained at outside hospital  Plan: Obtain restratification labs Obtain echocardiogram MRI/MRA head  # Hypertension-continue losartan , hydrochlorothiazide , Lasix   # Depression-continue Celexa    Admission status: Inpatient Telemetry  Certification: The appropriate patient status for this patient is INPATIENT. Inpatient status is judged to be reasonable and necessary in order to provide the required intensity of service to ensure the patient's safety. The patient's presenting symptoms, physical exam findings, and initial radiographic and laboratory data in the context of their chronic comorbidities is felt to place them  at high risk for further clinical deterioration. Furthermore, it is not anticipated that the patient will be medically stable for discharge from the hospital within 2 midnights of admission.   * I certify that at the point of admission it is my clinical judgment that the patient will require inpatient hospital care spanning beyond 2 midnights from the point of admission due to high intensity of service, high risk for further deterioration and high frequency of surveillance required.DEWAINE Lamar Dess MD [facility] If 7PM-7AM, please contact night-coverage [email or web address]  12/28/2023, 8:26 PM

## 2023-12-28 NOTE — ED Provider Notes (Signed)
  Shelby Day EMERGENCY DEPARTMENT AT [facility] Provider Assume Care Note I assumed care of Shelby Day on 12/28/2023 at 3 PM from Dr. Patsey.   Briefly, Shelby Day is a 78 y.o. female who: PMHx: HTN, HDL, elevated BMI, depression P/w abnormal neurologic deficits Shelby Day presented for left-sided weakness and paresthesias, now only with face paresthesias, LNK 11 PM last night 12/8 Neurology consulted, recommends admission for MRI and further stroke workup    Plan at the time of handoff: Awaiting callback from medicine for admission   Please refer to the original provider's note for additional information regarding the care of Shelby Day.  Reassessment: I personally reassessed the patient: Patient without any acute complaints or additional questions.  Vital Signs:  ED Triage Vitals [12/28/23 1258]  Encounter Vitals Group     BP (!) 155/82     Girls Systolic BP Percentile      Girls Diastolic BP Percentile      Boys Systolic BP Percentile      Boys Diastolic BP Percentile      Pulse Rate 96     Resp 18     Temp 98.3 F (36.8 C)     Temp Source Oral     SpO2 100 %     Weight      Height      Head Circumference      Peak Flow      Pain Score      Pain Loc      Pain Education      Exclude from Growth Chart      Hemodynamics:  The patient is hemodynamically stable. Mental Status:  The patient is alert  Additional MDM: Patient reports the paresthesias of her face have partially improved, is unsure if they are still mildly present with if they have completely resolved.  Spoke with Dr. Melvin who accepted the patient to his service.  No additional acute events while patient was under my care.  Disposition: ADMIT: I believe the patient requires admission for further care and management. The patient was admitted to hospitalists. Please see inpatient provider note for additional treatment plan details.    FREDRIK CANDIE Later, MD Emergency Medicine     Later Shelby GORMAN, MD 12/28/23 [phone]

## 2023-12-28 NOTE — ED Triage Notes (Signed)
 LKN 1200.  Left sided weakness and left sided facial numbness. States symptoms have improved some but not normal. States visual field changes and balance issues.   Activated at 1255.

## 2023-12-29 ENCOUNTER — Inpatient Hospital Stay (HOSPITAL_COMMUNITY)

## 2023-12-29 ENCOUNTER — Other Ambulatory Visit (HOSPITAL_COMMUNITY): Payer: Self-pay

## 2023-12-29 DIAGNOSIS — R29818 Other symptoms and signs involving the nervous system: Secondary | ICD-10-CM | POA: Diagnosis not present

## 2023-12-29 DIAGNOSIS — G459 Transient cerebral ischemic attack, unspecified: Secondary | ICD-10-CM | POA: Diagnosis present

## 2023-12-29 LAB — LIPID PANEL
Cholesterol: 212 mg/dL — ABNORMAL HIGH (ref 0–200)
HDL: 46 mg/dL (ref >40)
LDL Cholesterol: 130 mg/dL — ABNORMAL HIGH (ref 0–99)
Total CHOL/HDL Ratio: 4.6 ratio
Triglycerides: 178 mg/dL — ABNORMAL HIGH (ref <150)
VLDL: 36 mg/dL (ref 0–40)

## 2023-12-29 LAB — ECHOCARDIOGRAM COMPLETE
Area-P 1/2: 3.31 cm2
Calc EF: 69 %
S' Lateral: 2.7 cm
Single Plane A2C EF: 67.6 %
Single Plane A4C EF: 70 %

## 2023-12-29 LAB — HEMOGLOBIN A1C
Hgb A1c MFr Bld: 5.1 % (ref 4.8–5.6)
Mean Plasma Glucose: 99.67 mg/dL

## 2023-12-29 MED ORDER — CLOPIDOGREL BISULFATE 75 MG PO TABS
75.0000 mg | ORAL_TABLET | Freq: Every day | ORAL | 0 refills | Status: AC
  Filled 2023-12-29: qty 21, 21d supply, fill #0

## 2023-12-29 MED ORDER — ASPIRIN 81 MG PO CHEW
81.0000 mg | CHEWABLE_TABLET | Freq: Two times a day (BID) | ORAL | Status: DC
  Administered 2023-12-29: 81 mg via ORAL
  Filled 2023-12-29: qty 1

## 2023-12-29 MED ORDER — ATORVASTATIN CALCIUM 40 MG PO TABS
40.0000 mg | ORAL_TABLET | Freq: Every day | ORAL | 0 refills | Status: AC
  Filled 2023-12-29: qty 30, 30d supply, fill #0

## 2023-12-29 MED ORDER — ATORVASTATIN CALCIUM 10 MG PO TABS
40.0000 mg | ORAL_TABLET | Freq: Every day | ORAL | Status: DC
  Filled 2023-12-29: qty 4

## 2023-12-29 MED ORDER — IOHEXOL 350 MG/ML SOLN
100.0000 mL | Freq: Once | INTRAVENOUS | Status: AC | PRN
  Administered 2023-12-29: 100 mL via INTRAVENOUS

## 2023-12-29 MED ORDER — CLOPIDOGREL BISULFATE 75 MG PO TABS
75.0000 mg | ORAL_TABLET | Freq: Every day | ORAL | Status: DC
  Administered 2023-12-29: 75 mg via ORAL
  Filled 2023-12-29: qty 1

## 2023-12-29 MED ORDER — ASPIRIN 81 MG PO CHEW
81.0000 mg | CHEWABLE_TABLET | Freq: Two times a day (BID) | ORAL | 0 refills | Status: AC
  Filled 2023-12-29: qty 60, 30d supply, fill #0

## 2023-12-29 NOTE — Progress Notes (Signed)
 PROGRESS NOTE  Shelby Day  DOB: 1945/09/25  PCP: Aisha Harvey, MD FMW:[id number]  DOA: 12/28/2023  LOS: 1 day  Hospital Day: 2  Subjective: Patient was seen and examined this morning. Pleasant elderly Caucasian female.  Lying on bed.  Not in distress.  No new symptoms. Hemodynamically stable Labs this morning with LDL 130  Brief narrative: Shelby Day is a 78 y.o. female with PMH significant for HTN, OSA on CPAP, arthritis, diverticulosis. 12/9, patient presented to ED at [facility] with vision changes that she woke up that morning with. Teleneurology consulted NIH stroke score 1 CT head did not show any acute findings but showed remote lacunar infarct.  Admitted for TIA workup MRI brain did not show acute abnormality  Assessment and plan: TIA CT head and MRI brain nonrevealing Stroke order set in Echo with bubble pending PTA meds-not on any antiplatelet or anticoagulant Per neurology recommendation, patient has been started on aspirin  81 mg daily, Plavix  75 mg daily for 3 weeks to be followed by aspirin  alone  Hypertension PTA meds- losartan  25 mg daily, HCTZ 12.5 mg daily, Lasix  20 mg daily Currently continued on the same. Unclear if she was taking both Lasix  and HCTZ at home.  Pharmacy to clarify.  HLD Lipitor   Depression continue Celexa  10 mg daily     Nutrition Status:         Mobility:   PT Orders: Active   PT Follow up Rec:     Goals of care   Code Status: Full Code     DVT prophylaxis:  enoxaparin  (LOVENOX ) injection 40 mg Start: 12/28/23 2200 SCD's Start: 12/28/23 2025   Antimicrobials: None Fluid: Stop IV fluid Consultants: Neurology Family Communication: None at bedside  Status: Observation Level of care:  Telemetry   Patient is from: Home Needs to continue in-hospital care: Ongoing stroke workup Anticipated d/c to: Hopefully home this after versus tomorrow      Diet:  Diet Order             Diet Heart Room  service appropriate? Yes; Fluid consistency: Thin  Diet effective now                   Scheduled Meds:  aspirin   81 mg Oral BID   atorvastatin   40 mg Oral Daily   citalopram   10 mg Oral q morning   clopidogrel   75 mg Oral Daily   enoxaparin  (LOVENOX ) injection  40 mg Subcutaneous Q24H   furosemide   20 mg Oral Daily   hydrochlorothiazide   12.5 mg Oral q morning   losartan   25 mg Oral Daily   sodium chloride  flush  3 mL Intravenous Once    PRN meds: acetaminophen  **OR** acetaminophen  (TYLENOL ) oral liquid 160 mg/5 mL **OR** acetaminophen    Infusions:     Antimicrobials: Anti-infectives (From admission, onward)    None       Objective: Vitals:   12/28/23 2354 12/29/23 0345  BP: (!) 113/58 118/65  Pulse: 68 71  Resp: 17 17  Temp: 98.1 F (36.7 C) 97.8 F (36.6 C)  SpO2: 95% 96%    Intake/Output Summary (Last 24 hours) at 12/29/2023 1144 Last data filed at 12/29/2023 0740 Gross per 24 hour  Intake 334.83 ml  Output --  Net 334.83 ml   There were no vitals filed for this visit. Weight change:  There is no height or weight on file to calculate BMI.   Physical Exam: General exam: Pleasant, elderly Caucasian  female Skin: No rashes, lesions or ulcers. HEENT: Atraumatic, normocephalic, no obvious bleeding Lungs: Clear to auscultation bilaterally,  CVS: S1, S2, no murmur, alert, awake, oriented x 3 GI/Abd: Soft, nontender, nondistended, bowel sound present,   CNS: Alert, awake, oriented x 3 Psychiatry: Mood appropriate Extremities: Trace bilateral pedal edema, no calf tenderness,   Data Review: I have personally reviewed the laboratory data and studies available.  F/u labs ordered Unresulted Labs (From admission, onward)    None       Signed, Chapman Rota, MD [facility] 12/29/2023

## 2023-12-29 NOTE — Care Management CC44 (Signed)
 Condition Code 44 Documentation Completed  Patient Details  Name: Shelby Day MRN: [id number] Date of Birth: 1945-01-21   Condition Code 44 given:  Yes Patient signature on Condition Code 44 notice:  Yes Documentation of 2 MD's agreement:  Yes Code 44 added to claim:  Yes    Andrez JULIANNA George, RN 12/29/2023, 12:25 PM

## 2023-12-29 NOTE — Progress Notes (Signed)
 SLP Cancellation Note  Patient Details Name: Shelby Day MRN: [id number] DOB: 26-Jan-1945   Cancelled treatment:       Reason Eval/Treat Not Completed: SLP screened, no needs identified, will sign off. MRI negative. No SLP needs at this time.    Danay Mckellar, Consuelo Fitch 12/29/2023, 2:43 PM

## 2023-12-29 NOTE — Evaluation (Signed)
 Occupational Therapy Evaluation and DISCHARGE   Patient Details Name: Shelby Day MRN: [id number] DOB: 1945/12/16 Today's Date: 12/29/2023   History of Present Illness   78 yo F adm 12/9 facial numbness and vision changes. NIH 1. CT with remote lacunar infarct noted MRI negative. Workup for TIA. PMH: HTN, sleep apnea     Clinical Impressions PTA, pt lived with husband and was mod I for BADL, IADL, and driving. Upon eval, pt presents at baseline based on assessment; did have min inconsistency with report of LLQ visual field; encouraged full visual field assessment with OP ophthalmologist for comprehensive workup. Pt able to perform line bisection and letter cancellation [facility]. Do not suspect need for follow up OT after discharge.        If plan is discharge home, recommend the following:   Other (comment) (on pt request)     Functional Status Assessment   Patient has had a recent decline in their functional status and demonstrates the ability to make significant improvements in function in a reasonable and predictable amount of time.     Equipment Recommendations   None recommended by OT     Recommendations for Other Services    (encouraged full visual fields assessment at OP opthalmologist)     Precautions/Restrictions   Precautions Precautions: None Recall of Precautions/Restrictions: Intact Restrictions Weight Bearing Restrictions Per Provider Order: No     Mobility Bed Mobility Overal bed mobility: Modified Independent                  Transfers Overall transfer level: Modified independent Equipment used: None               General transfer comment: increased BOS      Balance Overall balance assessment: Mild deficits observed, not formally tested                                         ADL either performed or assessed with clinical judgement   ADL Overall ADL's : Modified independent                                              Vision Baseline Vision/History: 0 No visual deficits Ability to See in Adequate Light: 0 Adequate Patient Visual Report: No change from baseline Vision Assessment?: Yes Eye Alignment: Within Functional Limits Ocular Range of Motion: Within Functional Limits Alignment/Gaze Preference: Within Defined Limits Tracking/Visual Pursuits: Able to track stimulus in all quads without difficulty Saccades: Within functional limits Convergence: Within functional limits Visual Fields: No apparent deficits (in LLQ, pt did have some inconsistent responses re: how many fingers therapist holding up, getting 3/5 correct. will cotninue to assess)     Perception Perception: Within Functional Limits       Praxis Praxis: [facility]       Pertinent Vitals/Pain Pain Assessment Pain Assessment: No/denies pain     Extremity/Trunk Assessment Upper Extremity Assessment Upper Extremity Assessment: Right hand dominant;Overall [facility] for tasks assessed   Lower Extremity Assessment Lower Extremity Assessment: Defer to PT evaluation       Communication Communication Communication: No apparent difficulties   Cognition Arousal: Alert Behavior During Therapy: [facility] for tasks assessed/performed Cognition: No apparent impairments  Following commands: Intact       Cueing  General Comments          Exercises     Shoulder Instructions      Home Living Family/patient expects to be discharged to:: Private residence Living Arrangements: Spouse/significant other                           Home Equipment: Agricultural Consultant (2 wheels);Grab bars - tub/shower;Shower seat;Toilet riser          Prior Functioning/Environment                      OT Problem List: Impaired vision/perception   OT Treatment/Interventions:        OT Goals(Current goals can be found in the care plan section)   Acute Rehab OT  Goals Patient Stated Goal: go home OT Goal Formulation: With patient Time For Goal Achievement: 01/12/24 Potential to Achieve Goals: Good   OT Frequency:       Co-evaluation              AM-PAC OT 6 Clicks Daily Activity     Outcome Measure Help from another person eating meals?: None Help from another person taking care of personal grooming?: None Help from another person toileting, which includes using toliet, bedpan, or urinal?: None Help from another person bathing (including washing, rinsing, drying)?: None Help from another person to put on and taking off regular upper body clothing?: None Help from another person to put on and taking off regular lower body clothing?: None 6 Click Score: 24   End of Session Nurse Communication: Mobility status  Activity Tolerance: Patient tolerated treatment well Patient left: in bed;with call bell/phone within reach;with family/visitor present  OT Visit Diagnosis: Low vision, both eyes (H54.2)                Time: 8793-8775 OT Time Calculation (min): 18 min Charges:  OT General Charges $OT Visit: 1 Visit OT Evaluation $OT Eval Low Complexity: 1 Low  Shelby Day, Shelby Day [facility] Acute [facility] Office: [phone]   Shelby Day 12/29/2023, 1:21 PM

## 2023-12-29 NOTE — Plan of Care (Signed)
°  Problem: Clinical Measurements: Goal: Ability to maintain clinical measurements within normal limits will improve Outcome: Progressing Goal: Will remain free from infection Outcome: Progressing Goal: Diagnostic test results will improve Outcome: Progressing Goal: Respiratory complications will improve Outcome: Progressing Goal: Cardiovascular complication will be avoided Outcome: Progressing   Problem: Activity: Goal: Risk for activity intolerance will decrease Outcome: Progressing   Problem: Nutrition: Goal: Adequate nutrition will be maintained Outcome: Progressing   Problem: Elimination: Goal: Will not experience complications related to bowel motility Outcome: Progressing Goal: Will not experience complications related to urinary retention Outcome: Progressing   Problem: Pain Managment: Goal: General experience of comfort will improve and/or be controlled Outcome: Progressing   Problem: Safety: Goal: Ability to remain free from injury will improve Outcome: Progressing   Problem: Education: Goal: Knowledge of disease or condition will improve Outcome: Progressing Goal: Knowledge of secondary prevention will improve (MUST DOCUMENT ALL) Outcome: Progressing Goal: Knowledge of patient specific risk factors will improve (DELETE if not current risk factor) Outcome: Progressing   Problem: Skin Integrity: Goal: Risk for impaired skin integrity will decrease Outcome: Progressing

## 2023-12-29 NOTE — TOC Transition Note (Signed)
 Transition of Care [facility]) - Discharge Note   Patient Details  Name: BREIGH ANNETT MRN: [id number] Date of Birth: Oct 11, 1945  [facility] CM/SW Contact:  Andrez JULIANNA George, RN Phone Number: 12/29/2023, 12:52 PM   Clinical Narrative:     ROXANA LAI is a 78 y.o. female with medical history significant of hypertension, sleep apnea who presented to outside facility with vision changes.  Last night she was her usual self and woke up with vision changes and facial numbness.   Pt is discharging home with outpatient referral sent to [facility]. Information on the AVS. Pt will call to schedule the first appointment. DME at home: cane/ walker/ BSC/ shower seat--pt currently doesn't use any DME. No new needs. Pt drives but spouse can assist. He will transport her home today and provide supervision at home. Pt manages her own medications and denies any issues.  Final next level of care: OP Rehab Barriers to Discharge: No Barriers Identified   Patient Goals and CMS Choice            Discharge Placement                       Discharge Plan and Services Additional resources added to the After Visit Summary for                                       Social Drivers of Health (SDOH) Interventions SDOH Screenings   Tobacco Use: Low Risk  (08/01/2023)     Readmission Risk Interventions     No data to display

## 2023-12-29 NOTE — Progress Notes (Signed)
 STROKE TEAM PROGRESS NOTE   SUBJECTIVE (INTERVAL HISTORY) Her husband and daughters are at the bedside.  Overall her condition is completely resolved.  She stated that yesterday she got up from recliner felt whole-body weakness, lightheadedness and left face numbness, denies lateralized symptoms, denies speech difficulty.  Endorses some blurry vision.  Denies headache or migraine history.   OBJECTIVE Temp:  [97.8 F (36.6 C)-98.8 F (37.1 C)] 97.8 F (36.6 C) (12/10 0345) Pulse Rate:  [66-71] 71 (12/10 0345) Cardiac Rhythm: Normal sinus rhythm (12/10 0700) Resp:  [16-17] 17 (12/10 0345) BP: (113-147)/(58-75) 118/65 (12/10 0345) SpO2:  [95 %-98 %] 96 % (12/10 0345)  Recent Labs  Lab 12/28/23 1259  GLUCAP 152*   Recent Labs  Lab 12/28/23 1326  NA 143  K 3.8  CL 106  CO2 27  GLUCOSE 121*  BUN 13  CREATININE 0.77  CALCIUM  9.8   Recent Labs  Lab 12/28/23 1326  AST 35  ALT 31  ALKPHOS 90  BILITOT 0.6  PROT 6.8  ALBUMIN 4.4   Recent Labs  Lab 12/28/23 1326  WBC 5.2  NEUTROABS 2.2  HGB 15.5*  HCT 44.1  MCV 95.9  PLT 222   No results for input(s): CKTOTAL, CKMB, CKMBINDEX, TROPONINI in the last 168 hours. Recent Labs    12/28/23 1326  LABPROT 13.2  INR 1.0   No results for input(s): COLORURINE, LABSPEC, PHURINE, GLUCOSEU, HGBUR, BILIRUBINUR, KETONESUR, PROTEINUR, UROBILINOGEN, NITRITE, LEUKOCYTESUR in the last 72 hours.  Invalid input(s): APPERANCEUR     Component Value Date/Time   CHOL 212 (H) 12/29/2023 0212   TRIG 178 (H) 12/29/2023 0212   HDL 46 12/29/2023 0212   CHOLHDL 4.6 12/29/2023 0212   VLDL 36 12/29/2023 0212   LDLCALC 130 (H) 12/29/2023 0212   Lab Results  Component Value Date   HGBA1C 5.1 12/29/2023   No results found for: LABOPIA, COCAINSCRNUR, LABBENZ, AMPHETMU, THCU, LABBARB  Recent Labs  Lab 12/28/23 1326  ETH <15    I have personally reviewed the radiological images below and agree  with the radiology interpretations.  ECHOCARDIOGRAM COMPLETE Result Date: 12/29/2023    ECHOCARDIOGRAM REPORT   Patient Name:   ANANYA MCCLEESE Date of Exam: 12/29/2023 Medical Rec #:  [id number]       Height:       63.0 in Accession #:    [id number]      Weight:       270.1 lb Date of Birth:  1945/11/15       BSA:          2.197 m Patient Age:    78 years        BP:           118/65 mmHg Patient Gender: F               HR:           75 bpm. Exam Location:  Inpatient Procedure: 2D Echo (Both Spectral and Color Flow Doppler were utilized during            procedure). Indications:    CVA  History:        Patient has no prior history of Echocardiogram examinations.  Sonographer:    Charmaine Gaskins Referring Phys: [id number] ROBERT DORRELL IMPRESSIONS  1. Left ventricular ejection fraction, by estimation, is 60 to 65%. The left ventricle has normal function. The left ventricle has no regional wall motion abnormalities. Left ventricular diastolic parameters are consistent with Grade  I diastolic dysfunction (impaired relaxation).  2. Right ventricular systolic function is normal. The right ventricular size is normal.  3. The mitral valve is normal in structure. Trivial mitral valve regurgitation. No evidence of mitral stenosis.  4. The aortic valve is tricuspid. Aortic valve regurgitation is not visualized. No aortic stenosis is present.  5. The inferior vena cava is normal in size with greater than 50% respiratory variability, suggesting right atrial pressure of 3 mmHg. FINDINGS  Left Ventricle: Left ventricular ejection fraction, by estimation, is 60 to 65%. The left ventricle has normal function. The left ventricle has no regional wall motion abnormalities. The left ventricular internal cavity size was normal in size. There is  no left ventricular hypertrophy. Left ventricular diastolic parameters are consistent with Grade I diastolic dysfunction (impaired relaxation). Right Ventricle: The right ventricular size is  normal. No increase in right ventricular wall thickness. Right ventricular systolic function is normal. Left Atrium: Left atrial size was normal in size. Right Atrium: Right atrial size was normal in size. Pericardium: There is no evidence of pericardial effusion. Mitral Valve: The mitral valve is normal in structure. Trivial mitral valve regurgitation. No evidence of mitral valve stenosis. Tricuspid Valve: The tricuspid valve is normal in structure. Tricuspid valve regurgitation is not demonstrated. No evidence of tricuspid stenosis. Aortic Valve: The aortic valve is tricuspid. Aortic valve regurgitation is not visualized. No aortic stenosis is present. Pulmonic Valve: The pulmonic valve was normal in structure. Pulmonic valve regurgitation is not visualized. No evidence of pulmonic stenosis. Aorta: The aortic root is normal in size and structure. Venous: The inferior vena cava is normal in size with greater than 50% respiratory variability, suggesting right atrial pressure of 3 mmHg. IAS/Shunts: No atrial level shunt detected by color flow Doppler.  LEFT VENTRICLE PLAX 2D LVIDd:         4.10 cm     Diastology LVIDs:         2.70 cm     LV e' medial:    6.09 cm/s LV PW:         1.00 cm     LV E/e' medial:  9.4 LV IVS:        1.00 cm     LV e' lateral:   9.36 cm/s LVOT diam:     2.00 cm     LV E/e' lateral: 6.1 LVOT Area:     3.14 cm  LV Volumes (MOD) LV vol d, MOD A2C: 62.0 ml LV vol d, MOD A4C: 51.3 ml LV vol s, MOD A2C: 20.1 ml LV vol s, MOD A4C: 15.4 ml LV SV MOD A2C:     41.9 ml LV SV MOD A4C:     51.3 ml LV SV MOD BP:      39.7 ml RIGHT VENTRICLE RV Basal diam:  2.50 cm RV Mid diam:    2.20 cm RV S prime:     14.40 cm/s LEFT ATRIUM             Index        RIGHT ATRIUM           Index LA diam:        2.90 cm 1.32 cm/m   RA Area:     14.60 cm LA Vol (A2C):   61.0 ml 27.77 ml/m  RA Volume:   31.20 ml  14.20 ml/m LA Vol (A4C):   36.3 ml 16.52 ml/m LA Biplane Vol: 47.8 ml 21.76 ml/m   AORTA Ao  Root diam:  2.90 cm Ao Asc diam:  3.60 cm MITRAL VALVE                TRICUSPID VALVE MV Area (PHT): 3.31 cm     TR Peak grad:   18.5 mmHg MV Decel Time: 229 msec     TR Vmax:        215.00 cm/s MV E velocity: 57.10 cm/s MV A velocity: 107.00 cm/s  SHUNTS MV E/A ratio:  0.53         Systemic Diam: 2.00 cm Oneil Parchment MD Electronically signed by Oneil Parchment MD Signature Date/Time: 12/29/2023/1:17:43 PM    Final    CT ANGIO HEAD NECK W WO CM Result Date: 12/29/2023 EXAM: CTA HEAD AND NECK WITHOUT AND WITH 12/29/2023 10:01:10 AM TECHNIQUE: CTA of the head and neck was performed without and with the administration of 100 mL iohexol (OMNIPAQUE) 350 MG/ML injection. Multiplanar 2D and/or 3D reformatted images are provided for review. Automated exposure control, iterative reconstruction, and/or weight based adjustment of the mA/kV was utilized to reduce the radiation dose to as low as reasonably achievable. Stenosis of the internal carotid arteries measured using NASCET criteria. COMPARISON: MRI brain 2025, MRA neck 2025, CT head 2025. CLINICAL HISTORY: Stroke/TIA, determine embolic source. FINDINGS: CTA NECK: AORTIC ARCH AND ARCH VESSELS: No dissection or arterial injury. No significant stenosis of the brachiocephalic or subclavian arteries. CERVICAL CAROTID ARTERIES: There is obscuration of the proximal bilateral common carotid arteries secondary to streak artifact from intravenous contrast bolus. No dissection or arterial injury. CERVICAL VERTEBRAL ARTERIES: No dissection, arterial injury, or significant stenosis. LUNGS AND MEDIASTINUM: Unremarkable. SOFT TISSUES: No acute abnormality. BONES: No acute abnormality. CTA HEAD: ANTERIOR CIRCULATION: No significant stenosis of the internal carotid arteries. No significant stenosis of the anterior cerebral arteries. No significant stenosis of the middle cerebral arteries. No aneurysm. POSTERIOR CIRCULATION: No significant stenosis of the posterior cerebral arteries. No significant  stenosis of the basilar artery. No significant stenosis of the vertebral arteries. No aneurysm. OTHER: Orbits demonstrate bilateral lens replacement. Unchanged scattered white matter hypodensities which are nonspecific but most commonly represent chronic microvascular ischemic changes. Re-demonstrated chronic lacunar infarction anterior limb of the left internal capsule. Bilateral scapholomas versus axial myopia. No suspicious extraaxial fluid collection. IMPRESSION: 1. CT Head: No acute intracranial hemorrhage or demarcated territorial infarction. 2. CTA: No large vessel occlusion, hemodynamically significant stenosis, or aneurysm in the head or neck. 3. Limited evaluation of the proximal bilateral common carotid arteries due to streak artifact from intravenous contrast bolus. Electronically signed by: prentice bybordi 12/29/2023 11:08 AM EST RP Workstation: [id number]   MR ANGIO NECK W WO CONTRAST Result Date: 12/29/2023 EXAM: MRA Neck without and with contrast 12/28/2023 11:11:33 PM TECHNIQUE: Multiplanar multisequence MRA of the neck was performed without and with the administration of intravenous contrast. 2D and 3D reformatted images are provided for review. Stenosis of the internal carotid arteries is measured using NASCET criteria. CONTRAST: 10 mL of gadobutrol  (GADAVIST ) 1 MMOL/ML injection. COMPARISON: None available CLINICAL HISTORY: stroke FINDINGS: CAROTID ARTERIES: No dissection. No hemodynamically significant stenosis by NASCET criteria. VERTEBRAL ARTERIES: No dissection. No significant stenosis. IMPRESSION: 1. No hemodynamically significant stenosis. Electronically signed by: Gilmore Molt MD 12/29/2023 02:05 AM EST RP Workstation: [id number]   MR BRAIN WO CONTRAST Result Date: 12/29/2023 EXAM: MRI BRAIN WITHOUT CONTRAST 12/28/2023 11:09:56 PM TECHNIQUE: Multiplanar multisequence MRI of the head/brain was performed without the administration of intravenous contrast. COMPARISON: CT head  earlier today. CLINICAL HISTORY: Stroke.  FINDINGS: BRAIN AND VENTRICLES: No acute infarct. No intracranial hemorrhage. No mass. No midline shift. No hydrocephalus. The sella is unremarkable. Normal flow voids. ORBITS: No acute abnormality. SINUSES AND MASTOIDS: No acute abnormality. BONES AND SOFT TISSUES: Normal marrow signal. IMPRESSION: 1. No acute intracranial abnormality. Electronically signed by: Gilmore Molt MD 12/29/2023 02:01 AM EST RP Workstation: [id number]   CT HEAD CODE STROKE WO CONTRAST Result Date: 12/28/2023 EXAM: CT HEAD WITHOUT 12/28/2023 01:10:23 PM TECHNIQUE: CT of the head was performed without the administration of intravenous contrast. Automated exposure control, iterative reconstruction, and/or weight based adjustment of the mA/kV was utilized to reduce the radiation dose to as low as reasonably achievable. COMPARISON: 08/01/2023 CLINICAL HISTORY: Neuro deficit, acute, stroke suspected. FINDINGS: BRAIN AND VENTRICLES: No acute intracranial hemorrhage. No mass effect or midline shift. No extra-axial fluid collection. No evidence of acute infarct. No hydrocephalus. 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 Nonspecific hypoattenuation in the periventricular and subcortical white matter, most likely representing chronic microvascular ischemic changes. Mild parenchymal volume loss. Atherosclerosis of the carotid siphons. [place] Stroke Program Early CT (ASPECT) score: Ganglionic (caudate, IC, lentiform nucleus, insula, M1-M3): 7. Supraganglionic (M4-M6): 3. Total: 10. ORBITS: Bilateral lens replacement. SINUSES AND MASTOIDS: No acute abnormality. SOFT TISSUES AND SKULL: No acute skull fracture. No acute soft tissue abnormality. IMPRESSION: 1. No acute intracranial abnormality. 2. ASPECTS 10. 3. 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 Electronically signed by: Donnice Mania MD 12/28/2023 01:21 PM EST RP  Workstation: [id number]     PHYSICAL EXAM  Temp:  [97.8 F (36.6 C)-98.8 F (37.1 C)] 97.8 F (36.6 C) (12/10 0345) Pulse Rate:  [66-71] 71 (12/10 0345) Resp:  [16-17] 17 (12/10 0345) BP: (113-147)/(58-75) 118/65 (12/10 0345) SpO2:  [95 %-98 %] 96 % (12/10 0345)  General - Well nourished, well developed, in no apparent distress.  Ophthalmologic - fundi not visualized due to noncooperation.  Cardiovascular - Regular rhythm and rate.  Mental Status -  Level of arousal and orientation to time, place, and person were intact. Language including expression, naming, repetition, comprehension was assessed and found intact. Attention span and concentration were normal. Recent and remote memory were intact. Fund of Knowledge was assessed and was intact.  Cranial Nerves II - XII - II - Visual field intact OU. III, IV, VI - Extraocular movements intact. V - Facial sensation intact bilaterally. VII - Facial movement intact bilaterally. VIII - Hearing & vestibular intact bilaterally. X - Palate elevates symmetrically. XI - Chin turning & shoulder shrug intact bilaterally. XII - Tongue protrusion intact.  Motor Strength - The patients strength was normal in all extremities and pronator drift was absent.  Bulk was normal and fasciculations were absent.   Motor Tone - Muscle tone was assessed at the neck and appendages and was normal.  Reflexes - The patients reflexes were symmetrical in all extremities and she had no pathological reflexes  Sensory - Light touch, temperature/pinprick were assessed and were symmetrical.    Coordination - The patient had normal movements in the hands and feet with no ataxia or dysmetria.  Tremor was absent.  Gait and Station - deferred.   ASSESSMENT/PLAN Ms. JAMIE HAFFORD is a 78 y.o. female with history of hypertension, hyperlipidemia, OSA admitted for lightheadedness, generalized weakness and left facial numbness.   TIA versus  presyncope Patient stated that yesterday she got up from recliner felt whole-body weakness, lightheadedness and left face numbness, denies lateralized  symptoms or speech difficulty.  Endorses some blurry vision.  Denies headache or migraine history. CT no acute abnormality, remote lacunar infarct left anterior limb of the internal capsule CT head and neck unremarkable MRI no acute infarct MRA neck unremarkable 2D Echo EF 60 to 65% LDL 130 HgbA1c 5.1 Lovenox  for VTE prophylaxis No antithrombotic prior to admission, now on aspirin  81 mg daily and clopidogrel  75 mg daily DAPT for 3 weeks and then aspirin  alone. Patient counseled to be compliant with her antithrombotic medications Ongoing aggressive stroke risk factor management Therapy recommendations: None Disposition: Home  Hypertension Home meds including losartan  Stable Long term BP goal normotensive  Hyperlipidemia Home meds: None LDL 130, goal < 70 Now on Lipitor 40 Continue statin at discharge  Other Stroke Risk Factors Advanced age Morbid obesity, There is no height or weight on file to calculate BMI.  Obstructive sleep apnea, on CPAP at home  Other Active Problems None  Hospital day # 1  Neurology will sign off. Please call with questions. Pt will follow up with stroke clinic NP at [facility] in about 4 weeks. Thanks for the consult.   Ary Cummins, MD PhD Stroke Neurology 12/29/2023 3:56 PM    To contact Stroke Continuity provider, please refer to [email or web address]. After hours, contact General Neurology

## 2023-12-29 NOTE — Care Management Obs Status (Signed)
 MEDICARE OBSERVATION STATUS NOTIFICATION   Patient Details  Name: TEREZ MONTEE MRN: [id number] Date of Birth: 05-Sep-1945   Medicare Observation Status Notification Given:  Yes    Andrez JULIANNA George, RN 12/29/2023, 12:25 PM

## 2023-12-29 NOTE — Discharge Summary (Signed)
 Physician Discharge Summary  Shelby Day FMW:[id number] DOB: 28-Mar-1945 DOA: 12/28/2023  PCP: Aisha Harvey, MD  Admit date: 12/28/2023 Discharge date: 12/29/2023  Admitted from: Home Discharge disposition: Home with outpatient PT  Recommendations at discharge:  Per neurology recommendation, you have been started on aspirin  81 mg daily, Plavix  75 mg daily for 3 weeks to be followed by aspirin  alone.   Subjective: Patient was seen and examined this morning. Pleasant elderly Caucasian female.  Lying on bed.  Not in distress.  No new symptoms. Hemodynamically stable Labs this morning with LDL 130  Brief narrative: Shelby Day is a 78 y.o. female with PMH significant for HTN, OSA on CPAP, arthritis, diverticulosis. 12/9, patient presented to ED at [facility] with vision changes that she woke up that morning with. Teleneurology consulted NIH stroke score 1 CT head did not show any acute findings but showed remote lacunar infarct.  Admitted for TIA workup MRI brain did not show acute abnormality  Hospital course: TIA Presented with vision changes.   CT head and MRI brain nonrevealing Symptoms gradually improving Echo with bubble showed EF 60 to 65%, G1 DD no intracardiac source of embolism. PTA meds-not on any antiplatelet or anticoagulant Per neurology recommendation, patient has been started on aspirin  81 mg daily, Plavix  75 mg daily for 3 weeks to be followed by aspirin  alone. Seen by PT. Outpatient PT recommended.  Hypertension PTA meds- losartan  25 mg daily, HCTZ 12.5 mg daily, Lasix  20 mg daily Currently continued on the same. Pharmacy checked to clarify.  It seems, she is taking both Lasix  and HCTZ at home.    HLD Lipitor   Depression continue Celexa  10 mg daily   Goals of care   Code Status: Full Code   Diet:  Diet Order             Diet general           Diet Heart Room service appropriate? Yes; Fluid consistency: Thin  Diet effective now                    Nutritional status:  There is no height or weight on file to calculate BMI.       Wounds:  -    Discharge Medications:   Allergies as of 12/29/2023       Reactions   Codeine Nausea And Vomiting        Medication List     STOP taking these medications    amoxicillin  500 MG tablet Commonly known as: AMOXIL    HYDROcodone -acetaminophen  5-325 MG tablet Commonly known as: NORCO/VICODIN       TAKE these medications    aspirin  81 MG chewable tablet Chew 1 tablet (81 mg total) by mouth 2 (two) times daily.   atorvastatin  40 MG tablet Commonly known as: LIPITOR Take 1 tablet (40 mg total) by mouth daily. Start taking on: December 30, 2023   calcium  carbonate 1500 (600 Ca) MG Tabs tablet Commonly known as: OSCAL Take 600 mg of elemental calcium  by mouth daily with breakfast.   citalopram  10 MG tablet Commonly known as: CELEXA  Take 10 mg by mouth every morning.   clopidogrel  75 MG tablet Commonly known as: PLAVIX  Take 1 tablet (75 mg total) by mouth daily for 21 days. Start taking on: December 30, 2023   furosemide  20 MG tablet Commonly known as: LASIX  Take 20 mg by mouth daily.   hydrochlorothiazide  25 MG tablet Commonly known as: HYDRODIURIL  Take 12.5  mg by mouth every morning.   JOINT HEALTH PO Take 1 tablet by mouth every morning.   Krill Oil 500 MG Caps Take 500 mg by mouth every morning.   losartan  25 MG tablet Commonly known as: COZAAR  Take 25 mg by mouth daily.   MELATONIN PO Take 0.5 tablets by mouth at bedtime as needed (sleep).   methocarbamol  500 MG tablet Commonly known as: ROBAXIN  Take 1 tablet (500 mg total) by mouth every 6 (six) hours as needed for muscle spasms.   multivitamin with minerals Tabs tablet Take 2 tablets by mouth every morning.   vitamin C  1000 MG tablet Take 1,000 mg by mouth every morning.   Vitamin D3 125 MCG (5000 UT) Tabs Take 5,000 Units by mouth every morning.         Follow  ups:    Follow-up Information     [facility]. Schedule an appointment as soon as possible for a visit.   Specialty: Rehabilitation Contact information: [address] [place]  [address] [phone]        Aisha Harvey, MD Follow up.   Specialty: Family Medicine Contact information: [address] [place] [address] [phone]                 Discharge Instructions:   Discharge Instructions     Ambulatory referral to Physical Therapy   Complete by: As directed    Call MD for:  difficulty breathing, headache or visual disturbances   Complete by: As directed    Call MD for:  extreme fatigue   Complete by: As directed    Call MD for:  hives   Complete by: As directed    Call MD for:  persistant dizziness or light-headedness   Complete by: As directed    Call MD for:  persistant nausea and vomiting   Complete by: As directed    Call MD for:  severe uncontrolled pain   Complete by: As directed    Call MD for:  temperature >100.4   Complete by: As directed    Diet general   Complete by: As directed    Discharge instructions   Complete by: As directed    Recommendations at discharge:   Per neurology recommendation, you have been started on aspirin  81 mg daily, Plavix  75 mg daily for 3 weeks to be followed by aspirin  alone.  General discharge instructions: Follow with Primary MD Aisha Harvey, MD in 7 days  Please request your PCP  to go over your hospital tests, procedures, radiology results at the follow up. Please get your medicines reviewed and adjusted.  Your PCP may decide to repeat certain labs or tests as needed. Do not drive, operate heavy machinery, perform activities at heights, swimming or participation in water  activities or provide baby sitting services if your were admitted for syncope or siezures until you have seen by Primary MD or a Neurologist and advised to do so again. [place] Controlled Substance Reporting System database was reviewed. Do not drive, operate heavy machinery, perform activities at heights, swim, participate in water  activities or provide baby-sitting services while on medications for pain, sleep and mood until your outpatient physician has reevaluated you and advised to do so again.  You are strongly recommended to comply with the dose, frequency and duration of prescribed medications. Activity: As tolerated with Full fall precautions use walker/cane & assistance as needed Avoid using any recreational substances like cigarette, tobacco, alcohol,  or non-prescribed drug. If you experience worsening of your admission symptoms, develop shortness of breath, life threatening emergency, suicidal or homicidal thoughts you must seek medical attention immediately by calling 911 or calling your MD immediately  if symptoms less severe. You must read complete instructions/literature along with all the possible adverse reactions/side effects for all the medicines you take and that have been prescribed to you. Take any new medicine only after you have completely understood and accepted all the possible adverse reactions/side effects.  Wear Seat belts while driving. You were cared for by a hospitalist during your hospital stay. If you have any questions about your discharge medications or the care you received while you were in the hospital after you are discharged, you can call the unit and ask to speak with the hospitalist or the covering physician. Once you are discharged, your primary care physician will handle any further medical issues. Please note that NO REFILLS for any discharge medications will be authorized once you are discharged, as it is imperative that you return to your primary care physician (or establish a relationship with a primary care physician if you do not have one).   Increase activity slowly   Complete by: As directed        Discharge Exam:    Vitals:   12/28/23 1730 12/28/23 1913 12/28/23 2354 12/29/23 0345  BP: 132/62 (!) 147/75 (!) 113/58 118/65  Pulse: 66 66 68 71  Resp: 16  17 17   Temp:  98.8 F (37.1 C) 98.1 F (36.7 C) 97.8 F (36.6 C)  TempSrc:  Oral Oral Oral  SpO2: 98% 95% 95% 96%    There is no height or weight on file to calculate BMI.  General exam: Pleasant, elderly Caucasian female Skin: No rashes, lesions or ulcers. HEENT: Atraumatic, normocephalic, no obvious bleeding Lungs: Clear to auscultation bilaterally,  CVS: S1, S2, no murmur, alert, awake, oriented x 3 GI/Abd: Soft, nontender, nondistended, bowel sound present,   CNS: Alert, awake, oriented x 3 Psychiatry: Mood appropriate Extremities: Trace bilateral pedal edema, no calf tenderness,    The results of significant diagnostics from this hospitalization (including imaging, microbiology, ancillary and laboratory) are listed below for reference.    Procedures and Diagnostic Studies:   ECHOCARDIOGRAM COMPLETE Result Date: 12/29/2023    ECHOCARDIOGRAM REPORT   Patient Name:   Shelby Day Date of Exam: 12/29/2023 Medical Rec #:  [id number]       Height:       63.0 in Accession #:    [id number]      Weight:       270.1 lb Date of Birth:  1945-04-23       BSA:          2.197 m Patient Age:    78 years        BP:           118/65 mmHg Patient Gender: F               HR:           75 bpm. Exam Location:  Inpatient Procedure: 2D Echo (Both Spectral and Color Flow Doppler were utilized during            procedure). Indications:    CVA  History:        Patient has no prior history of Echocardiogram examinations.  Sonographer:    Charmaine Gaskins Referring Phys: [id number] ROBERT DORRELL IMPRESSIONS  1. Left ventricular ejection fraction, by estimation,  is 60 to 65%. The left ventricle has normal function. The left ventricle has no regional wall motion abnormalities. Left ventricular diastolic parameters are consistent with Grade I diastolic dysfunction (impaired  relaxation).  2. Right ventricular systolic function is normal. The right ventricular size is normal.  3. The mitral valve is normal in structure. Trivial mitral valve regurgitation. No evidence of mitral stenosis.  4. The aortic valve is tricuspid. Aortic valve regurgitation is not visualized. No aortic stenosis is present.  5. The inferior vena cava is normal in size with greater than 50% respiratory variability, suggesting right atrial pressure of 3 mmHg. FINDINGS  Left Ventricle: Left ventricular ejection fraction, by estimation, is 60 to 65%. The left ventricle has normal function. The left ventricle has no regional wall motion abnormalities. The left ventricular internal cavity size was normal in size. There is  no left ventricular hypertrophy. Left ventricular diastolic parameters are consistent with Grade I diastolic dysfunction (impaired relaxation). Right Ventricle: The right ventricular size is normal. No increase in right ventricular wall thickness. Right ventricular systolic function is normal. Left Atrium: Left atrial size was normal in size. Right Atrium: Right atrial size was normal in size. Pericardium: There is no evidence of pericardial effusion. Mitral Valve: The mitral valve is normal in structure. Trivial mitral valve regurgitation. No evidence of mitral valve stenosis. Tricuspid Valve: The tricuspid valve is normal in structure. Tricuspid valve regurgitation is not demonstrated. No evidence of tricuspid stenosis. Aortic Valve: The aortic valve is tricuspid. Aortic valve regurgitation is not visualized. No aortic stenosis is present. Pulmonic Valve: The pulmonic valve was normal in structure. Pulmonic valve regurgitation is not visualized. No evidence of pulmonic stenosis. Aorta: The aortic root is normal in size and structure. Venous: The inferior vena cava is normal in size with greater than 50% respiratory variability, suggesting right atrial pressure of 3 mmHg. IAS/Shunts: No atrial level  shunt detected by color flow Doppler.  LEFT VENTRICLE PLAX 2D LVIDd:         4.10 cm     Diastology LVIDs:         2.70 cm     LV e' medial:    6.09 cm/s LV PW:         1.00 cm     LV E/e' medial:  9.4 LV IVS:        1.00 cm     LV e' lateral:   9.36 cm/s LVOT diam:     2.00 cm     LV E/e' lateral: 6.1 LVOT Area:     3.14 cm  LV Volumes (MOD) LV vol d, MOD A2C: 62.0 ml LV vol d, MOD A4C: 51.3 ml LV vol s, MOD A2C: 20.1 ml LV vol s, MOD A4C: 15.4 ml LV SV MOD A2C:     41.9 ml LV SV MOD A4C:     51.3 ml LV SV MOD BP:      39.7 ml RIGHT VENTRICLE RV Basal diam:  2.50 cm RV Mid diam:    2.20 cm RV S prime:     14.40 cm/s LEFT ATRIUM             Index        RIGHT ATRIUM           Index LA diam:        2.90 cm 1.32 cm/m   RA Area:     14.60 cm LA Vol (A2C):   61.0 ml 27.77 ml/m  RA  Volume:   31.20 ml  14.20 ml/m LA Vol (A4C):   36.3 ml 16.52 ml/m LA Biplane Vol: 47.8 ml 21.76 ml/m   AORTA Ao Root diam: 2.90 cm Ao Asc diam:  3.60 cm MITRAL VALVE                TRICUSPID VALVE MV Area (PHT): 3.31 cm     TR Peak grad:   18.5 mmHg MV Decel Time: 229 msec     TR Vmax:        215.00 cm/s MV E velocity: 57.10 cm/s MV A velocity: 107.00 cm/s  SHUNTS MV E/A ratio:  0.53         Systemic Diam: 2.00 cm Oneil Parchment MD Electronically signed by Oneil Parchment MD Signature Date/Time: 12/29/2023/1:17:43 PM    Final    CT ANGIO HEAD NECK W WO CM Result Date: 12/29/2023 EXAM: CTA HEAD AND NECK WITHOUT AND WITH 12/29/2023 10:01:10 AM TECHNIQUE: CTA of the head and neck was performed without and with the administration of 100 mL iohexol (OMNIPAQUE) 350 MG/ML injection. Multiplanar 2D and/or 3D reformatted images are provided for review. Automated exposure control, iterative reconstruction, and/or weight based adjustment of the mA/kV was utilized to reduce the radiation dose to as low as reasonably achievable. Stenosis of the internal carotid arteries measured using NASCET criteria. COMPARISON: MRI brain 2025, MRA neck 2025, CT head  2025. CLINICAL HISTORY: Stroke/TIA, determine embolic source. FINDINGS: CTA NECK: AORTIC ARCH AND ARCH VESSELS: No dissection or arterial injury. No significant stenosis of the brachiocephalic or subclavian arteries. CERVICAL CAROTID ARTERIES: There is obscuration of the proximal bilateral common carotid arteries secondary to streak artifact from intravenous contrast bolus. No dissection or arterial injury. CERVICAL VERTEBRAL ARTERIES: No dissection, arterial injury, or significant stenosis. LUNGS AND MEDIASTINUM: Unremarkable. SOFT TISSUES: No acute abnormality. BONES: No acute abnormality. CTA HEAD: ANTERIOR CIRCULATION: No significant stenosis of the internal carotid arteries. No significant stenosis of the anterior cerebral arteries. No significant stenosis of the middle cerebral arteries. No aneurysm. POSTERIOR CIRCULATION: No significant stenosis of the posterior cerebral arteries. No significant stenosis of the basilar artery. No significant stenosis of the vertebral arteries. No aneurysm. OTHER: Orbits demonstrate bilateral lens replacement. Unchanged scattered white matter hypodensities which are nonspecific but most commonly represent chronic microvascular ischemic changes. Re-demonstrated chronic lacunar infarction anterior limb of the left internal capsule. Bilateral scapholomas versus axial myopia. No suspicious extraaxial fluid collection. IMPRESSION: 1. CT Head: No acute intracranial hemorrhage or demarcated territorial infarction. 2. CTA: No large vessel occlusion, hemodynamically significant stenosis, or aneurysm in the head or neck. 3. Limited evaluation of the proximal bilateral common carotid arteries due to streak artifact from intravenous contrast bolus. Electronically signed by: prentice bybordi 12/29/2023 11:08 AM EST RP Workstation: [id number]   MR ANGIO NECK W WO CONTRAST Result Date: 12/29/2023 EXAM: MRA Neck without and with contrast 12/28/2023 11:11:33 PM TECHNIQUE: Multiplanar  multisequence MRA of the neck was performed without and with the administration of intravenous contrast. 2D and 3D reformatted images are provided for review. Stenosis of the internal carotid arteries is measured using NASCET criteria. CONTRAST: 10 mL of gadobutrol  (GADAVIST ) 1 MMOL/ML injection. COMPARISON: None available CLINICAL HISTORY: stroke FINDINGS: CAROTID ARTERIES: No dissection. No hemodynamically significant stenosis by NASCET criteria. VERTEBRAL ARTERIES: No dissection. No significant stenosis. IMPRESSION: 1. No hemodynamically significant stenosis. Electronically signed by: Gilmore Molt MD 12/29/2023 02:05 AM EST RP Workstation: [id number]   MR BRAIN WO CONTRAST Result Date: 12/29/2023 EXAM: MRI BRAIN WITHOUT  CONTRAST 12/28/2023 11:09:56 PM TECHNIQUE: Multiplanar multisequence MRI of the head/brain was performed without the administration of intravenous contrast. COMPARISON: CT head earlier today. CLINICAL HISTORY: Stroke. FINDINGS: BRAIN AND VENTRICLES: No acute infarct. No intracranial hemorrhage. No mass. No midline shift. No hydrocephalus. The sella is unremarkable. Normal flow voids. ORBITS: No acute abnormality. SINUSES AND MASTOIDS: No acute abnormality. BONES AND SOFT TISSUES: Normal marrow signal. IMPRESSION: 1. No acute intracranial abnormality. Electronically signed by: Gilmore Molt MD 12/29/2023 02:01 AM EST RP Workstation: [id number]   CT HEAD CODE STROKE WO CONTRAST Result Date: 12/28/2023 EXAM: CT HEAD WITHOUT 12/28/2023 01:10:23 PM TECHNIQUE: CT of the head was performed without the administration of intravenous contrast. Automated exposure control, iterative reconstruction, and/or weight based adjustment of the mA/kV was utilized to reduce the radiation dose to as low as reasonably achievable. COMPARISON: 08/01/2023 CLINICAL HISTORY: Neuro deficit, acute, stroke suspected. FINDINGS: BRAIN AND VENTRICLES: No acute intracranial hemorrhage. No mass effect or midline shift. No  extra-axial fluid collection. No evidence of acute infarct. No hydrocephalus. 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 Nonspecific hypoattenuation in the periventricular and subcortical white matter, most likely representing chronic microvascular ischemic changes. Mild parenchymal volume loss. Atherosclerosis of the carotid siphons. [place] Stroke Program Early CT (ASPECT) score: Ganglionic (caudate, IC, lentiform nucleus, insula, M1-M3): 7. Supraganglionic (M4-M6): 3. Total: 10. ORBITS: Bilateral lens replacement. SINUSES AND MASTOIDS: No acute abnormality. SOFT TISSUES AND SKULL: No acute skull fracture. No acute soft tissue abnormality. IMPRESSION: 1. No acute intracranial abnormality. 2. ASPECTS 10. 3. 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 Electronically signed by: Donnice Mania MD 12/28/2023 01:21 PM EST RP Workstation: [id number]     Labs:   Basic Metabolic Panel: Recent Labs  Lab 12/28/23 1326  NA 143  K 3.8  CL 106  CO2 27  GLUCOSE 121*  BUN 13  CREATININE 0.77  CALCIUM  9.8   GFR CrCl cannot be calculated (Unknown ideal weight.). Liver Function Tests: Recent Labs  Lab 12/28/23 1326  AST 35  ALT 31  ALKPHOS 90  BILITOT 0.6  PROT 6.8  ALBUMIN 4.4   No results for input(s): LIPASE, AMYLASE in the last 168 hours. No results for input(s): AMMONIA in the last 168 hours. Coagulation profile Recent Labs  Lab 12/28/23 1326  INR 1.0    CBC: Recent Labs  Lab 12/28/23 1326  WBC 5.2  NEUTROABS 2.2  HGB 15.5*  HCT 44.1  MCV 95.9  PLT 222   Cardiac Enzymes: No results for input(s): CKTOTAL, CKMB, CKMBINDEX, TROPONINI in the last 168 hours. BNP: Invalid input(s): POCBNP CBG: Recent Labs  Lab 12/28/23 1259  GLUCAP 152*   D-Dimer No results for input(s): DDIMER in the last 72 hours. Hgb A1c Recent Labs    12/29/23 0212  HGBA1C 5.1   Lipid Profile Recent Labs     12/29/23 0212  CHOL 212*  HDL 46  LDLCALC 130*  TRIG 178*  CHOLHDL 4.6   Thyroid function studies No results for input(s): TSH, T4TOTAL, T3FREE, THYROIDAB in the last 72 hours.  Invalid input(s): FREET3 Anemia work up No results for input(s): VITAMINB12, FOLATE, FERRITIN, TIBC, IRON, RETICCTPCT in the last 72 hours. Microbiology No results found for this or any previous visit (from the past 240 hours).  Time coordinating discharge: 45 minutes  Signed: Mahalia Dykes  [facility] 12/29/2023, 2:46 PM

## 2023-12-29 NOTE — TOC CAGE-AID Note (Signed)
 Transition of Care [facility]) - CAGE-AID Screening   Patient Details  Name: Shelby Day MRN: [id number] Date of Birth: 15-Oct-1945  Transition of Care [facility]) CM/SW Contact:    Andrez JULIANNA George, RN Phone Number: 12/29/2023, 12:36 PM   Clinical Narrative: Pt denies the need for counseling resources   CAGE-AID Screening:    Have You Ever Felt You Ought to Cut Down on Your Drinking or Drug Use?: No Have People Annoyed You By Critizing Your Drinking Or Drug Use?: No Have You Felt Bad Or Guilty About Your Drinking Or Drug Use?: No Have You Ever Had a Drink or Used Drugs First Thing In The Morning to Steady Your Nerves or to Get Rid of a Hangover?: No CAGE-AID Score: 0  Substance Abuse Education Offered: Yes (denied the need)

## 2023-12-29 NOTE — Progress Notes (Signed)
°  Echocardiogram 2D Echocardiogram has been performed.  Shelby Day 12/29/2023, 1:14 PM

## 2023-12-29 NOTE — Evaluation (Signed)
 Physical Therapy Brief Evaluation and Discharge Note Patient Details Name: Shelby Day MRN: [id number] DOB: 11/30/1945 Today's Date: 12/29/2023   History of Present Illness  78 yo F adm 12/9 facial numbness and vision changes. NIH 1. CT with remote lacunar infarct noted MRI negative. Workup for TIA. PMH: HTN, sleep apnea  Clinical Impression  Currently pt is presenting at Mod I for bed mobility, sit to stand and gait without an AD. Pt reports one fall in the past 6 months. No notable deficits with coordination. Pt would benefit from some out patient physical therapy on discharge in order to address balance, strength to decrease risk for falls. Pt is presenting close to baseline and will be discharged from skilled physical therapy services in acute care setting at this time. Please re-consult if further needs arise.        PT Assessment All further PT needs can be met in the next venue of care  Assistance Needed at Discharge  PRN    Equipment Recommendations None recommended by PT     Precautions/Restrictions Precautions Precautions: None Recall of Precautions/Restrictions: Intact Restrictions Weight Bearing Restrictions Per Provider Order: No        Mobility  Bed Mobility   Supine/Sidelying to sit: Modified independent (Device/Increased time)   General bed mobility comments: slight increase in time  Transfers Overall transfer level: Modified independent Equipment used: None     General transfer comment: increased BOS    Ambulation/Gait Ambulation/Gait assistance: Modified independent (Device/Increase time) Gait Distance (Feet): 400 Feet Assistive device: None Gait Pattern/deviations: Step-through pattern, Decreased stride length, Wide base of support Gait Speed: Below normal General Gait Details: slightly increased BOS, minimal heel toe gait pattern, no LOB  Home Activity Instructions Home Activity Instructions: call [facility]PPT clinic  Stairs Stairs: Yes Stairs  assistance: Modified independent (Device/Increase time) Stair Management: One rail Left, Step to pattern, Forwards Number of Stairs: 2 General stair comments: pt son is placing rail at home; previously spouse assisted with descending stairs. LIght UE support on rail with descending stairs and no UE support with ascending.  Modified Rankin (Stroke Patients Only) Modified Rankin (Stroke Patients Only) Pre-Morbid Rankin Score: No symptoms Modified Rankin: No symptoms      Balance Overall balance assessment: Mild deficits observed, not formally tested   Sitting balance-Leahy Scale: Normal     Standing balance support: No upper extremity supported, During functional activity Standing balance-Leahy Scale: Fair Standing balance comment: increased BOS to compensate for impaired balance          Pertinent Vitals/Pain   Pain Assessment Pain Assessment: No/denies pain     Home Living Family/patient expects to be discharged to:: Private residence Living Arrangements: Spouse/significant other Available Help at Discharge: Family;Available 24 hours/day (spouse and son) Home Environment: Stairs to enter;No rail;Other (comment) (son is at home now putting up a hand rail; previously spouse was assisting with stairs.)  Stairs-Number of Steps: 3 Home Equipment: Rolling Walker (2 wheels);Grab bars - tub/shower;Shower seat;Toilet riser        Prior Function Level of Independence: Independent Comments: driving    UE/LE Assessment   UE ROM/Strength/Tone/Coordination: WFL    LE ROM/Strength/Tone/Coordination: [facility]      Communication   Communication Communication: No apparent difficulties     Cognition Overall Cognitive Status: Appears within functional limits for tasks assessed/performed              Assessment/Plan    PT Problem List Decreased balance;Decreased strength;Decreased activity tolerance  AMPAC 6 Clicks Help needed turning from your back to your  side while in a flat bed without using bedrails?: None Help needed moving from lying on your back to sitting on the side of a flat bed without using bedrails?: None Help needed moving to and from a bed to a chair (including a wheelchair)?: None Help needed standing up from a chair using your arms (e.g., wheelchair or bedside chair)?: None Help needed to walk in hospital room?: None Help needed climbing 3-5 steps with a railing? : A Little 6 Click Score: 23      End of Session Equipment Utilized During Treatment: Gait belt Activity Tolerance: Patient tolerated treatment well Patient left: in bed;with call bell/phone within reach Nurse Communication: Mobility status       Time: 8876-8856 PT Time Calculation (min) (ACUTE ONLY): 20 min  Charges:   PT Evaluation $PT Eval Low Complexity: 1 Low     Dorothyann Maier, DPT, CLT  Acute Rehabilitation Services Office: [phone] (Secure chat preferred)   Dorothyann VEAR Maier  12/29/2023, 11:49 AM
